# Patient Record
Sex: Female | Born: 1935 | Race: White | Hispanic: No | Smoking: Never smoker
Health system: Southern US, Community
[De-identification: ages and names within clinical notes are randomized; demographics above are authoritative.]

## PROBLEM LIST (undated history)

## (undated) DIAGNOSIS — K219 Gastro-esophageal reflux disease without esophagitis: Secondary | ICD-10-CM

## (undated) DIAGNOSIS — E039 Hypothyroidism, unspecified: Secondary | ICD-10-CM

## (undated) DIAGNOSIS — I10 Essential (primary) hypertension: Secondary | ICD-10-CM

## (undated) DIAGNOSIS — G20 Parkinson's disease: Secondary | ICD-10-CM

## (undated) DIAGNOSIS — G20A1 Parkinson's disease without dyskinesia, without mention of fluctuations: Secondary | ICD-10-CM

## (undated) DIAGNOSIS — M1990 Unspecified osteoarthritis, unspecified site: Secondary | ICD-10-CM

## (undated) HISTORY — DX: Parkinson's disease: G20

## (undated) HISTORY — DX: Gastro-esophageal reflux disease without esophagitis: K21.9

## (undated) HISTORY — DX: Hypothyroidism, unspecified: E03.9

## (undated) HISTORY — DX: Unspecified osteoarthritis, unspecified site: M19.90

## (undated) HISTORY — DX: Parkinson's disease without dyskinesia, without mention of fluctuations: G20.A1

## (undated) HISTORY — DX: Essential (primary) hypertension: I10

---

## 1957-09-23 HISTORY — PX: APPENDECTOMY: SHX54

## 1958-09-23 HISTORY — PX: THYROIDECTOMY: SHX17

## 1996-09-23 HISTORY — PX: CHOLECYSTECTOMY: SHX55

## 2005-05-29 ENCOUNTER — Ambulatory Visit: Payer: Self-pay | Admitting: Internal Medicine

## 2006-05-16 ENCOUNTER — Ambulatory Visit: Payer: Self-pay | Admitting: Internal Medicine

## 2006-06-03 ENCOUNTER — Ambulatory Visit: Payer: Self-pay | Admitting: Internal Medicine

## 2006-12-08 ENCOUNTER — Ambulatory Visit: Payer: Self-pay | Admitting: Ophthalmology

## 2006-12-15 ENCOUNTER — Ambulatory Visit: Payer: Self-pay | Admitting: Ophthalmology

## 2007-06-09 ENCOUNTER — Ambulatory Visit: Payer: Self-pay | Admitting: Internal Medicine

## 2008-03-14 ENCOUNTER — Ambulatory Visit: Payer: Self-pay | Admitting: Unknown Physician Specialty

## 2008-06-09 ENCOUNTER — Ambulatory Visit: Payer: Self-pay | Admitting: Internal Medicine

## 2008-07-07 ENCOUNTER — Ambulatory Visit: Payer: Self-pay | Admitting: Otolaryngology

## 2008-07-19 ENCOUNTER — Ambulatory Visit: Payer: Self-pay | Admitting: Otolaryngology

## 2009-06-14 ENCOUNTER — Ambulatory Visit: Payer: Self-pay | Admitting: Internal Medicine

## 2010-06-25 ENCOUNTER — Ambulatory Visit: Payer: Self-pay | Admitting: Internal Medicine

## 2010-11-13 ENCOUNTER — Encounter: Payer: Self-pay | Admitting: Rheumatology

## 2010-11-22 ENCOUNTER — Encounter: Payer: Self-pay | Admitting: Rheumatology

## 2011-06-14 ENCOUNTER — Ambulatory Visit: Payer: Self-pay | Admitting: Neurology

## 2011-08-05 ENCOUNTER — Ambulatory Visit: Payer: Self-pay | Admitting: Internal Medicine

## 2012-08-05 ENCOUNTER — Ambulatory Visit: Payer: Self-pay | Admitting: Internal Medicine

## 2013-08-06 ENCOUNTER — Ambulatory Visit: Payer: Self-pay | Admitting: Internal Medicine

## 2013-09-23 HISTORY — PX: BACK SURGERY: SHX140

## 2013-09-29 ENCOUNTER — Encounter: Payer: Self-pay | Admitting: Neurology

## 2013-10-24 ENCOUNTER — Encounter: Payer: Self-pay | Admitting: Neurology

## 2013-12-03 DIAGNOSIS — I10 Essential (primary) hypertension: Secondary | ICD-10-CM | POA: Insufficient documentation

## 2013-12-03 DIAGNOSIS — G5600 Carpal tunnel syndrome, unspecified upper limb: Secondary | ICD-10-CM | POA: Insufficient documentation

## 2013-12-03 DIAGNOSIS — M159 Polyosteoarthritis, unspecified: Secondary | ICD-10-CM | POA: Insufficient documentation

## 2013-12-03 DIAGNOSIS — G20 Parkinson's disease: Secondary | ICD-10-CM | POA: Insufficient documentation

## 2014-02-17 ENCOUNTER — Ambulatory Visit: Payer: Self-pay | Admitting: Internal Medicine

## 2014-02-21 ENCOUNTER — Ambulatory Visit: Payer: Self-pay | Admitting: Orthopedic Surgery

## 2014-02-22 ENCOUNTER — Ambulatory Visit: Payer: Self-pay | Admitting: Orthopedic Surgery

## 2014-02-25 LAB — PATHOLOGY REPORT

## 2014-03-08 DIAGNOSIS — Z9889 Other specified postprocedural states: Secondary | ICD-10-CM | POA: Insufficient documentation

## 2014-08-08 ENCOUNTER — Ambulatory Visit: Payer: Self-pay | Admitting: Internal Medicine

## 2014-12-07 DIAGNOSIS — M48061 Spinal stenosis, lumbar region without neurogenic claudication: Secondary | ICD-10-CM | POA: Insufficient documentation

## 2014-12-07 DIAGNOSIS — M5116 Intervertebral disc disorders with radiculopathy, lumbar region: Secondary | ICD-10-CM | POA: Insufficient documentation

## 2014-12-23 DIAGNOSIS — E039 Hypothyroidism, unspecified: Secondary | ICD-10-CM | POA: Insufficient documentation

## 2015-01-14 NOTE — Op Note (Signed)
PATIENT NAME:  Patricia GaleMCDOWELL, [name] MR#:  [id number] DATE OF BIRTH:  1934-03-16  DATE OF PROCEDURE:  02/22/2014  PREOPERATIVE DIAGNOSIS: T12 compression fracture.   POSTOPERATIVE DIAGNOSIS: T12 compression fracture.  PROCEDURE: T12 kyphoplasty.   ANESTHESIA: MAC.   SURGEON: Kennedy BuckerMichael Cristoval Teall, M.D.   DESCRIPTION OF PROCEDURE: The patient was brought to the Operating Room and, after adequate anesthesia, the patient was placed in a prone position. The C-arm was brought in, in AP and lateral projections. Good visualization of T12, which was markedly compressed. After patient identification and timeout procedures were completed, the back was prepped with alcohol; 10 mL was infiltrated, with 5 mL on either side of the planned approaches. After prepping and draping in the usual sterile fashion, repeat timeout procedure and patient identification was performed. A spinal needle was then used to get down to the level of the pedicle and infiltrated on the right side and left side; 10 mL of this mixture under fluoroscopic guidance. A small stab incision was made on the right side and a trocar advanced in an extrapedicular approach. Drilling was then carried out under fluoroscopy, and did cross the midline, and a very tight space under the superior endplate compression deformity. A balloon was placed and inflated to approximately 3.5 mL, with partial correction of a significant deformity. Cement was mixed, and the cement was inserted, with good fill. AP, lateral permanent views were obtained. The wound was covered on the right side with Dermabond, and a Band-Aid on the left, although no incision was made. Patient was sent to the recovery room in stable condition.   ESTIMATED BLOOD LOSS: Minimal.   COMPLICATIONS: None.   SPECIMEN: T12 vertebral body biopsy with minimal bone obtained.  CONDITION: To recovery room, stable.    ____________________________ Leitha SchullerMichael J. Charlise Giovanetti, MD mjm:cg D: 02/23/2014 00:58:09  ET T: 02/23/2014 02:35:15 ET JOB#: [id number]  cc: Leitha SchullerMichael J. Sharion Grieves, MD, <Dictator> Leitha SchullerMICHAEL J Birda Didonato MD ELECTRONICALLY SIGNED 02/24/2014 7:24

## 2015-03-10 ENCOUNTER — Ambulatory Visit: Payer: Self-pay

## 2015-03-23 ENCOUNTER — Encounter: Payer: Self-pay | Admitting: Urology

## 2015-03-23 ENCOUNTER — Ambulatory Visit (INDEPENDENT_AMBULATORY_CARE_PROVIDER_SITE_OTHER): Payer: PPO | Admitting: Urology

## 2015-03-23 VITALS — BP 137/78 | HR 101 | Ht 63.0 in | Wt 160.1 lb

## 2015-03-23 DIAGNOSIS — N3946 Mixed incontinence: Secondary | ICD-10-CM

## 2015-03-23 LAB — BLADDER SCAN AMB NON-IMAGING

## 2015-03-23 NOTE — Progress Notes (Signed)
I have been asked to see the patient by Dr. Sherryll BurgerShah, for evaluation and management of urinary incontinence.  History of present illness: [age]F seen today for incontinence.  She has a history of parkinson's disease.   The patient's incontinence really started about one year ago. She particularly has incontinence at nighttime. She states that she awakes from an urge to void and before she can make it to the bathroom she has voided almost completely. She gets up twice every night on average. This is not gotten significantly worse over the last several years. During the day she does not have any incontinence to speak of. She denies leakage with stress maneuvers such as laughing coughing, bending, and exercise. She states that she occasionally has difficulty getting or strain to start, and an equal number of times feels that she does not empty her bladder completely. However, this is not all the time. She denies any postvoid dribbling. She occasionally stops and starts prior to finishing her void completely. She describes a fairly weak stream. Overall, she denies any progression of her voiding symptoms over the last several years. The patient maintains her uterus and denies any symptoms of prolapse including vaginal pain/pressure. She does state that she was given topical steroid-induced because she had genital adhesions. This was sometime ago, she has not taken anything since.   She does not have a history of urinary tract infections, kidney stones, or gross hematuria. The patient has had 5 vaginal deliveries. The patient's Parkinson's disease as well controlled, she takes a small amount of dopamine agonist. This was recently increased slightly, however for the most part her symptoms have been minimal/stable. PVR: 12 mL's.  Review of systems: A 12 point comprehensive review of systems was obtained and is negative unless otherwise stated in the history of present illness.  There are no active problems to display for  this patient.   No current outpatient prescriptions on file prior to visit.   No current facility-administered medications on file prior to visit.    No past medical history on file.  No past surgical history on file.  History  Substance Use Topics  . Smoking status: Not on file  . Smokeless tobacco: Not on file  . Alcohol Use: Not on file    No family history on file.  PE: There were no vitals filed for this visit. Patient appears to be in no acute distress  patient is alert and oriented x3 Atraumatic normocephalic head No cervical or supraclavicular lymphadenopathy appreciated No increased work of breathing, no audible wheezes/rhonchi Regular sinus rhythm/rate Abdomen is soft, nontender, nondistended, no CVA or suprapubic tenderness The patient's vaginal exam demonstrates fusion of her labia with obliteration of the vaginal introitus. The urethral meatus is patent. This is consistent with lichen sclerosus. Lower extremities are symmetric without appreciable edema Grossly neurologically intact No identifiable skin lesions  No results for input(s): WBC, HGB, HCT in the last 72 hours. No results for input(s): NA, K, CL, CO2, GLUCOSE, BUN, CREATININE, CALCIUM in the last 72 hours. No results for input(s): LABPT, INR in the last 72 hours. No results for input(s): LABURIN in the last 72 hours. No results found for this or any previous visit.  Imaging: none  Imp:  The patient appears to have urge incontinence, worse at night. This is common in patients with Parkinson's disease. Her symptoms seem to be worse with a full bladder. 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  Recommendations:  We discussed behavior modifications including setting or alarm at night so that she voids more frequently and try to avoid over distention of her bladder which leads to her urge and overactivity. I also gave the patient Myrbetriq 50 mg  daily which I recommended that she consider to help with her overactive bladder. Plan for the patient to contact our office if she feels like the medication is helping and she would like a refill. As relates to the patient's lichen sclerosus, at this point I think I would leave it as is, we did discuss sterile cream if her urethra started to close, however it is fully patent on exam today.  Cc: Dr. Bethann Punches Cc: Dr. Clelia Croft, neurology [place], Earle Gell

## 2015-03-24 LAB — MICROSCOPIC EXAMINATION
Bacteria, UA: NONE SEEN
Epithelial Cells (non renal): >10 /hpf — AB (ref 0–10)

## 2015-03-24 LAB — URINALYSIS, COMPLETE
Bilirubin, UA: NEGATIVE
Glucose, UA: NEGATIVE
Nitrite, UA: NEGATIVE
Protein, UA: NEGATIVE
RBC, UA: NEGATIVE
Specific Gravity, UA: 1.015 (ref 1.005–1.030)
Urobilinogen, Ur: 1 mg/dL (ref 0.2–1.0)
pH, UA: 6.5 (ref 5.0–7.5)

## 2015-06-13 ENCOUNTER — Ambulatory Visit: Payer: PPO | Admitting: Physical Therapy

## 2015-06-13 ENCOUNTER — Ambulatory Visit: Payer: PPO | Attending: Neurology | Admitting: Speech Pathology

## 2015-06-13 DIAGNOSIS — R531 Weakness: Secondary | ICD-10-CM | POA: Insufficient documentation

## 2015-06-13 DIAGNOSIS — R490 Dysphonia: Secondary | ICD-10-CM | POA: Insufficient documentation

## 2015-06-13 DIAGNOSIS — R262 Difficulty in walking, not elsewhere classified: Secondary | ICD-10-CM | POA: Diagnosis present

## 2015-06-13 NOTE — Patient Instructions (Addendum)
PAIN: none POSTURE: WFL   PROM/AROM:grip left hand 8 lbs, right hand 14 lbs   STRENGTH:  Graded on a 0-5 scale Muscle Group Left Right  Shoulder flex 4 4  Shoulder Abd -4 -4  Shoulder Ext -4 -4  Shoulder IR/ER 4 4  Elbow 4 4  Wrist/hand 2 2  Hip Flex 3 3  Hip Abd 3 4  Hip Add 2 2  Hip Ext 2 2  Hip IR/ER    Knee Flex 4 4  Knee Ext 3+ 3+  Ankle DF 4 4  Ankle PF 4 4   SENSATION: WNL   SPECIAL TESTS: unable to perform 20 toe raises    FUNCTIONAL MOBILITY:WFL   BALANCE:static balance is por with inability to single leg raise Dynamic standing balance is poor with inabiity to tandem stand   GAIT: Patient ambulates with a SPC for short distances and decreased arm swing OUTCOME MEASURES: TEST Outcome Interpretation  5 times sit<>stand 24.75sec >27 yo, >15 sec indicates increased risk for falls  10 meter walk test .48                 m/s <1.0 m/s indicates increased risk for falls; limited community ambulator  Timed up and Go   21.03              sec <14 sec indicates increased risk for falls  6 minute walk test   700             Feet 1000 feet is community Financial controller  <36/56 (100% risk for falls), 37-45 (80% risk for falls); 46-51 (>50% risk for falls); 52-55 (lower risk <25% of falls)  9 Hole Peg Test L:31.86                R: 34.97     Low car transfer, writing, stepping over items, carrying items, arms fatigue with washing her hair, peeling a potato , holding a book, holding silver wear.

## 2015-06-13 NOTE — Therapy (Signed)
[facility] [facility] MAIN [facility] SERVICES [address] [place], [place], [address] Phone: [phone]   Fax:  [phone]  Physical Therapy Evaluation  Patient Details  Name: Patricia [name] MRN: [id number] Date of Birth: 1933-11-17 Referring Provider:  Lonell Face, MD  Encounter Date: 06/13/2015      PT End of Session - 06/13/15 1446    Visit Number 1   Number of Visits 17   Date for PT Re-Evaluation 07/18/15   PT Start Time 0300   PT Stop Time 0400   PT Time Calculation (min) 60 min   Activity Tolerance Patient tolerated treatment well   Behavior During Therapy [facility] for tasks assessed/performed      Past Medical History  Diagnosis Date  . GERD (gastroesophageal reflux disease)   . Arthritis   . Hypertension   . Hypothyroidism   . Parkinson disease     Past Surgical History  Procedure Laterality Date  . Back surgery  2015  . Appendectomy  1959  . Thyroidectomy  1960  . Cholecystectomy  1998    There were no vitals filed for this visit.  Visit Diagnosis:  Difficulty walking  Weakness      Subjective Assessment - 06/13/15 1453    Subjective Patient is concerned with her balance.    Currently in Pain? No/denies            [facility] PT Assessment - 06/13/15 0001    Assessment   Medical Diagnosis Parkinsons disease   Onset Date/Surgical Date 07/13/15   Hand Dominance Right   Next MD Visit July 25, 2015   Prior Therapy --  no   Precautions   Precautions None   Balance Screen   Has the patient fallen in the past 6 months No   Has the patient had a decrease in activity level because of a fear of falling?  Yes   Is the patient reluctant to leave their home because of a fear of falling?  No   Home Environment   Living Environment Private residence   Living Arrangements Alone   Available Help at Discharge Family   Type of Home House   Home Access Stairs to enter   Entrance Stairs-Number of Steps 5   Home Layout One  level   Home Equipment [place] - single point;Grab bars - tub/shower;Toilet riser   Prior Function   Level of Independence Independent        PAIN: none POSTURE: WFL   PROM/AROM:grip left hand 8 lbs, right hand 14 lbs   STRENGTH:  Graded on a 0-5 scale Muscle Group Left Right  Shoulder flex 4 4  Shoulder Abd -4 -4  Shoulder Ext -4 -4  Shoulder IR/ER 4 4  Elbow 4 4  Wrist/hand 2 2  Hip Flex 3 3  Hip Abd 3 4  Hip Add 2 2  Hip Ext 2 2  Hip IR/ER    Knee Flex 4 4  Knee Ext 3+ 3+  Ankle DF 4 4  Ankle PF 4 4   SENSATION: WNL   SPECIAL TESTS: unable to perform 20 toe raises    FUNCTIONAL MOBILITY:WFL   BALANCE:static balance is por with inability to single leg raise Dynamic standing balance is poor with inabiity to tandem stand   GAIT: Patient ambulates with a SPC for short distances and decreased arm swing OUTCOME MEASURES: TEST Outcome Interpretation  5 times sit<>stand 24.75sec >50 yo, >15 sec indicates increased risk for falls  10 meter walk test .48                 m/s <1.0 m/s indicates increased risk for falls; limited community ambulator  Timed up and Go   21.03              sec <14 sec indicates increased risk for falls  6 minute walk test   700             Feet 1000 feet is community Financial controller  <36/56 (100% risk for falls), 37-45 (80% risk for falls); 46-51 (>50% risk for falls); 52-55 (lower risk <25% of falls)  9 Hole Peg Test L:31.86                R: 34.97    Problems with : Low car transfer, writing, stepping over items, carrying items, arms fatigue with washing her hair, peeling a potato , holding a book, holding silver wea                    PT Education - 2015-07-06 1445    Education provided Yes   Education Details LSVT BIG   Person(s) Educated Patient   Methods Explanation   Comprehension Verbalized understanding             PT Long Term Goals - July 06, 2015 1517    PT LONG TERM GOAL #1   Title  Patient (> 20 years old) will complete five times sit to stand test in < 15 seconds indicating an increased LE strength and improved balance10/25/16   PT LONG TERM GOAL #2   Title Patient will increase six minute walk test distance to >1000 for progression to community ambulator and improve gait ability10/25/16   PT LONG TERM GOAL #3   Title Patient will increase 10 meter walk test to >1.28m/s as to improve gait speed for better community ambulation and to reduce fall risk. 07/18/15               Plan - 06-Jul-2015 1514    Clinical Impression Statement Patient is 81 years old has Dx of parkinsons and has unsteady gait with decreased strength BLE and hands. She has static and dynamic standing deficits and decreased functional mobiity.    Pt will benefit from skilled therapeutic intervention in order to improve on the following deficits Abnormal gait;Difficulty walking;Decreased activity tolerance;Decreased balance;Decreased strength;Decreased mobility;Decreased coordination   Rehab Potential Good   PT Frequency 4x / week   PT Duration 4 weeks   PT Treatment/Interventions Balance training;Therapeutic exercise;Therapeutic activities;Functional mobility training;Stair training;Gait training;ADLs/Self Care Home Management   PT Next Visit Plan LSVT BIG   Consulted and Agree with Plan of Care Patient          G-Codes - 06-Jul-2015 1520    Functional Assessment Tool Used 5 x sit to stand, tug, 6 MW   Functional Limitation Mobility: Walking and moving around   Mobility: Walking and Moving Around Current Status (W0981) At least 40 percent but less than 60 percent impaired, limited or restricted   Mobility: Walking and Moving Around Goal Status [phone]) At least 20 percent but less than 40 percent impaired, limited or restricted       Problem List Patient Active Problem List   Diagnosis Date Noted  . Acquired hypothyroidism 12/23/2014  . Neuritis or radiculitis due to rupture of lumbar  intervertebral disc 12/07/2014  . Lumbar canal stenosis 12/07/2014  . H/O surgical procedure 03/08/2014  .  Carpal tunnel syndrome 12/03/2013  . Benign essential HTN 12/03/2013  . Degenerative joint disease involving multiple joints 12/03/2013  . Idiopathic Parkinson's disease 12/03/2013    Ezekiel Ina 06/13/2015, 3:45 PM  [place] MAIN [facility] SERVICES [address] [place], [place], [address] Phone: [phone]   Fax:  [phone]

## 2015-06-14 ENCOUNTER — Encounter: Payer: Self-pay | Admitting: Speech Pathology

## 2015-06-14 NOTE — Therapy (Signed)
[place] [facility] MAIN [facility] SERVICES [address] [place], [place], [address] Phone: [phone]   Fax:  [phone]  Speech Language Pathology Evaluation  Patient Details  Name: Patricia [name] MRN: [id number] Date of Birth: September 05, 1934 Referring Provider:  Lonell Face, MD  Encounter Date: 06/13/2015      End of Session - 06/14/15 1129    Visit Number 1   Number of Visits 17   Date for SLP Re-Evaluation 07/21/15   SLP Start Time 1400   SLP Stop Time  1446   SLP Time Calculation (min) 46 min   Activity Tolerance Patient tolerated treatment well      Past Medical History  Diagnosis Date   GERD (gastroesophageal reflux disease)    Arthritis    Hypertension    Hypothyroidism    Parkinson disease     Past Surgical History  Procedure Laterality Date   Back surgery  2015   Appendectomy  1959   Thyroidectomy  1960   Cholecystectomy  1998    There were no vitals filed for this visit.  Visit Diagnosis: Dysphonia - Plan: SLP plan of care cert/re-cert      Subjective Assessment - 06/14/15 1127    Subjective The patient reports that she has observed changes due to Parkinson's; changes include hypophonia, hoarse vocal quality, and monotone speech.   Currently in Pain? No/denies            SLP Evaluation [facility] - 06/14/15 0001    SLP Visit Information   SLP Received On 06/13/15   Onset Date 02/07/2015   Medical Diagnosis Parkinson's disease   Subjective   Patient/Family Stated Goal To be understood   Pain Assessment   Currently in Pain? No/denies   Prior Functional Status   Cognitive/Linguistic Baseline --  Worsening speech intelligibility secondary PD   Standardized Assessments   Standardized Assessments  --  LSVT-LOUD Perceptual Voice Evaluation       LSVT-LOUD Voice Evaluation  Maximum phonation time for sustained ah: 5 seconds  Mean intensity during sustained ah: 58 dB   Mean intensity sustained  during conversational speech: 57 dB  Average fundamental frequency during sustained ah: 201 Hz (1.6 STD below mean for age and gender)  Highest dynamic pitch when altering pitch from a low note to a high note: 398 Hz  Highest pitch during conversational speech: 444 Hz  Lowest dynamic pitch when altering from a high note to a low note: 71 Hz  Lowest pitch during conversational speech: 91 Hz  Visi-Pitch: Multi-Dimensional Voice Program (MDVP)  MDVP extracts objective quantitative values (Relative Average Perturbation, Shimmer, Voice Turbulence Index, and Noise to Harmonic Ratio) on sustained phonation, which are displayed graphically and numerically in comparison to a built-in normative database.  The patient exhibited values outside the norm for Relative Average Perturbation, Shimmer, Voice Turbulence Index, and Noise to Harmonic Ratio.  Average fundamental frequency was 2.2 STD below the average for age and gender. The patient improved all parameters when cued to alter voicing (loud like me).          SLP Education - 06/14/15 1128    Education provided Yes   Education Details LSVT-LOUD   Person(s) Educated Patient   Methods Explanation   Comprehension Verbalized understanding            SLP Long Term Goals - 06/14/15 1131    SLP LONG TERM GOAL #1   Title The patient will complete Daily Tasks (Maximum duration "ah",  High/Lows, and Functional Phrases) at average loudness of 80 dB and with loud, good quality voice.    Time 4   Period Weeks   Status New   SLP LONG TERM GOAL #2   Title The patient will complete Hierarchal Speech Loudness reading drills (words/phrases, sentences, and paragraph) at average 75 dB and with loud, good quality voice.     Time 4   Period Weeks   Status New   SLP LONG TERM GOAL #3   Title The patient will complete homework daily.   Time 4   Period Weeks   Status New   SLP LONG TERM GOAL #4   Title The patient will participate in conversation,  maintaining average loudness of 75 dB and loud, good quality voice.   Time 4   Period Weeks   Status New          Plan - 06-17-15 1130    Clinical Impression Statement This 81 year old woman with diagnosed Parkinson' disease is presenting with moderate-severe voice disorder characterized by hoarse vocal quality, hypophonia, and monotone voice.  Based on stimulability testing, the patient is judged to be a good candidate for the LSVT LOUD program.  It is recommended that the patient receive the LSVT LOUD program which is comprised of 16 intensive sessions (4 times per week for 4 weeks, one hour sessions).  Prognosis for improvement is good based on his motivation, stimulability, and strong family support.  LSVT LOUD has been documented in the literature as efficacious for individuals with Parkinson's disease.     Speech Therapy Frequency 4x / week   Duration 4 weeks   Treatment/Interventions Other (comment)  LSVT-LOUD protocol   Potential to Achieve Goals Good   Potential Considerations Ability to learn/carryover information;Cooperation/participation level;Previous level of function;Severity of impairments;Other (comment)  Stimulability   SLP Home Exercise Plan LSVT-LOUD daily homework   Consulted and Agree with Plan of Care Patient          G-Codes - 06-17-15 1132    Functional Assessment Tool Used Perceptual Voice Evaluation   Functional Limitations Voice   Voice Current Status [phone]) At least 60 percent but less than 80 percent impaired, limited or restricted   Voice Goal Status (G9172) At least 1 percent but less than 20 percent impaired, limited or restricted      Problem List Patient Active Problem List   Diagnosis Date Noted   Acquired hypothyroidism 12/23/2014   Neuritis or radiculitis due to rupture of lumbar intervertebral disc 12/07/2014   Lumbar canal stenosis 12/07/2014   H/O surgical procedure 03/08/2014   Carpal tunnel syndrome 12/03/2013   Benign  essential HTN 12/03/2013   Degenerative joint disease involving multiple joints 12/03/2013   Idiopathic Parkinson's disease 12/03/2013   Dollene Primrose, MS/CCC- SLP  Leandrew Koyanagi 06/17/15, 11:36 AM  [place] [facility] MAIN [facility] SERVICES [address] [place], [place], [address] Phone: [phone]   Fax:  [phone]

## 2015-06-19 ENCOUNTER — Encounter: Payer: Self-pay | Admitting: Physical Therapy

## 2015-06-19 ENCOUNTER — Ambulatory Visit: Payer: PPO | Admitting: Physical Therapy

## 2015-06-19 ENCOUNTER — Ambulatory Visit: Payer: PPO | Admitting: Speech Pathology

## 2015-06-19 ENCOUNTER — Encounter: Payer: Self-pay | Admitting: Speech Pathology

## 2015-06-19 DIAGNOSIS — R531 Weakness: Secondary | ICD-10-CM

## 2015-06-19 DIAGNOSIS — R490 Dysphonia: Secondary | ICD-10-CM

## 2015-06-19 DIAGNOSIS — R262 Difficulty in walking, not elsewhere classified: Secondary | ICD-10-CM

## 2015-06-19 NOTE — Therapy (Signed)
[place] [facility] MAIN [facility] [employer] SERVICES [address] [place], [place], [address] Phone: [phone]   Fax:  [phone]  Speech Language Pathology Treatment  Patient Details  Name: Patricia [name] MRN: [id number] Date of Birth: 06/06/1934 Referring Provider:  Lonell Face, MD  Encounter Date: 06/19/2015      End of Session - 06/19/15 1510    Visit Number 2   Number of Visits 17   Date for SLP Re-Evaluation 07/21/15   SLP Start Time 1345   SLP Stop Time  1435   SLP Time Calculation (min) 50 min   Activity Tolerance Patient tolerated treatment well      Past Medical History  Diagnosis Date  . GERD (gastroesophageal reflux disease)   . Arthritis   . Hypertension   . Hypothyroidism   . Parkinson disease     Past Surgical History  Procedure Laterality Date  . Back surgery  2015  . Appendectomy  1959  . Thyroidectomy  1960  . Cholecystectomy  1998    There were no vitals filed for this visit.  Visit Diagnosis: Dysphonia      Subjective Assessment - 06/19/15 1510    Subjective Patient reports that her family have noticed her decreased loudness.   Currently in Pain? No/denies               ADULT SLP TREATMENT - 06/19/15 0001    General Information   Behavior/Cognition Alert;Cooperative;Pleasant mood   Treatment Provided   Treatment provided Cognitive-Linquistic   Pain Assessment   Pain Assessment No/denies pain   Cognitive-Linquistic Treatment   Treatment focused on Voice   Skilled Treatment Daily Task #1: Average 5 seconds, 60 dB. Daily Task 2: Highs: 15 high pitched "ah" given max cues. Lows: 15 low pitched "ah" given max cues. Daily task #3: Average 64 dB.  Hierarchal speech loudness drill: Read words/phrases, 64 dB. Homework: assignments given.  Off the cuff remarks: average 64 dB.     Assessment / Recommendations / Plan   Plan Continue with current plan of care   Progression Toward Goals   Progression toward goals  Progressing toward goals          SLP Education - 06/19/15 1510    Education provided Yes   Education Details LSVT-LOUD   Person(s) Educated Patient   Methods Explanation;Demonstration;Verbal cues;Handout   Comprehension Verbalized understanding;Returned demonstration;Verbal cues required;Need further instruction            SLP Long Term Goals - 06/14/15 1131    SLP LONG TERM GOAL #1   Title The patient will complete Daily Tasks (Maximum duration "ah", High/Lows, and Functional Phrases) at average loudness of 80 dB and with loud, good quality voice.    Time 4   Period Weeks   Status New   SLP LONG TERM GOAL #2   Title The patient will complete Hierarchal Speech Loudness reading drills (words/phrases, sentences, and paragraph) at average 75 dB and with loud, good quality voice.     Time 4   Period Weeks   Status New   SLP LONG TERM GOAL #3   Title The patient will complete homework daily.   Time 4   Period Weeks   Status New   SLP LONG TERM GOAL #4   Title The patient will participate in conversation, maintaining average loudness of 75 dB and loud, good quality voice.   Time 4   Period Weeks   Status New  Plan - 06/19/15 1511    Clinical Impression Statement The patient is completing daily tasks and hierarchal speech drill tasks with louder, better quality voice given max SLP cues.     Speech Therapy Frequency 4x / week   Duration 4 weeks   Treatment/Interventions Other (comment)  LSVT-LOUD   Potential to Achieve Goals Good   Potential Considerations Ability to learn/carryover information;Cooperation/participation level;Previous level of function;Severity of impairments;Other (comment)   SLP Home Exercise Plan LSVT-LOUD daily homework   Consulted and Agree with Plan of Care Patient        Problem List Patient Active Problem List   Diagnosis Date Noted  . Acquired hypothyroidism 12/23/2014  . Neuritis or radiculitis due to rupture of lumbar  intervertebral disc 12/07/2014  . Lumbar canal stenosis 12/07/2014  . H/O surgical procedure 03/08/2014  . Carpal tunnel syndrome 12/03/2013  . Benign essential HTN 12/03/2013  . Degenerative joint disease involving multiple joints 12/03/2013  . Idiopathic Parkinson's disease 12/03/2013   Dollene Primrose, MS/CCC- SLP  Leandrew Koyanagi 06/19/2015, 3:13 PM  [place] [facility] MAIN [facility] SERVICES [address] [place], [place], [address] Phone: [phone]   Fax:  [phone]

## 2015-06-19 NOTE — Therapy (Signed)
[place] [facility] MAIN [facility] SERVICES [address] [place], [place], [address] Phone: [phone]   Fax:  [phone]  Physical Therapy Treatment  Patient Details  Name: Patricia [name] MRN: [id number] Date of Birth: Mar 11, 1934 Referring Provider:  Lonell Face, MD  Encounter Date: 06/19/2015      PT End of Session - 06/19/15 1502    Visit Number 2   Authorization Type 2   PT Start Time 0300   PT Stop Time 0400   PT Time Calculation (min) 60 min   Activity Tolerance Patient tolerated treatment well      Past Medical History  Diagnosis Date  . GERD (gastroesophageal reflux disease)   . Arthritis   . Hypertension   . Hypothyroidism   . Parkinson disease     Past Surgical History  Procedure Laterality Date  . Back surgery  2015  . Appendectomy  1959  . Thyroidectomy  1960  . Cholecystectomy  1998    There were no vitals filed for this visit.  Visit Diagnosis:  Difficulty walking  Weakness      Subjective Assessment - 06/19/15 1501    Subjective Patient is concerned with her balance.    Currently in Pain? No/denies      floor to ceiling x 10 reps, side to side 10 reps, step and reach forward x 10 reps, step and reach backwards x 10,  step and reach sideways x 10 , Rock and reach forward/backward x 10 , Rock and reach sideways x 10, functional tasks; 1 sit to stand 2. Reaching with cones to duplicate putting dishes away 3. hand coordination tasks 4. stepping up from foam to stool 5. washing hair and drying hair  Patient continues to demonstrates less incoordination of movement with select exercises such as rock and reach and stepping backwards.  . Mod cueing needed to appropriately perform LSVT tasks with leg, hand, and head position. Decreased coordination demonstrated requiring consistent verbal cueing to correct form. Cognitive understanding of task was delayed. Patient continues to demonstrate some in coordination of movement  with select exercises such as rock and reach and stepping backwards. Patient responds well to verbal and tactile cues to correct form and technique.  CGA to SBA for safety with activities.  Uses to increase intensity and amplitude of movements throughout session                           PT Education - 06/19/15 1501    Education provided Yes   Education Details LSVT BIG   Person(s) Educated Patient   Methods Explanation   Comprehension Verbalized understanding             PT Long Term Goals - 06/13/15 1517    PT LONG TERM GOAL #1   Title Patient (> 14 years old) will complete five times sit to stand test in < 15 seconds indicating an increased LE strength and improved balance10/25/16   PT LONG TERM GOAL #2   Title Patient will increase six minute walk test distance to >1000 for progression to community ambulator and improve gait ability10/25/16   PT LONG TERM GOAL #3   Title Patient will increase 10 meter walk test to >1.25m/s as to improve gait speed for better community ambulation and to reduce fall risk. 07/18/15               Plan - 06/19/15 1503    Clinical Impression  Statement CGA to SBA for safety with activities.  Uses to increase intensity and amplitude of movements throughout session. pt especially has difficulty with opening his hands up towards the ceiling with multiple exercises and with extending his leg during seated side to side multi-directional reaching activity   Pt will benefit from skilled therapeutic intervention in order to improve on the following deficits Abnormal gait;Difficulty walking;Decreased activity tolerance;Decreased balance;Decreased strength;Decreased mobility;Decreased coordination   Rehab Potential Good   PT Frequency 4x / week   PT Duration 4 weeks   PT Treatment/Interventions Balance training;Therapeutic exercise;Therapeutic activities;Functional mobility training;Stair training;Gait training;ADLs/Self Care Home  Management   PT Next Visit Plan LSVT BIG   Consulted and Agree with Plan of Care Patient        Problem List Patient Active Problem List   Diagnosis Date Noted  . Acquired hypothyroidism 12/23/2014  . Neuritis or radiculitis due to rupture of lumbar intervertebral disc 12/07/2014  . Lumbar canal stenosis 12/07/2014  . H/O surgical procedure 03/08/2014  . Carpal tunnel syndrome 12/03/2013  . Benign essential HTN 12/03/2013  . Degenerative joint disease involving multiple joints 12/03/2013  . Idiopathic Parkinson's disease 12/03/2013    Ezekiel Ina 06/19/2015, 3:05 PM  [place] [facility] MAIN [facility] SERVICES [address] [place], [place], [address] Phone: [phone]   Fax:  [phone]

## 2015-06-20 ENCOUNTER — Encounter: Payer: Self-pay | Admitting: Physical Therapy

## 2015-06-20 ENCOUNTER — Ambulatory Visit: Payer: PPO | Admitting: Physical Therapy

## 2015-06-20 ENCOUNTER — Ambulatory Visit: Payer: PPO | Admitting: Speech Pathology

## 2015-06-20 DIAGNOSIS — R490 Dysphonia: Secondary | ICD-10-CM

## 2015-06-20 DIAGNOSIS — R262 Difficulty in walking, not elsewhere classified: Secondary | ICD-10-CM

## 2015-06-20 DIAGNOSIS — R531 Weakness: Secondary | ICD-10-CM

## 2015-06-20 NOTE — Therapy (Signed)
[place] [facility] MAIN [facility] SERVICES [address] [place], [place], [address] Phone: [phone]   Fax:  [phone]  Physical Therapy Treatment  Patient Details  Name: Patricia [name] MRN: [id number] Date of Birth: 07-26-1934 Referring Provider:  Lonell Face, MD  Encounter Date: 06/20/2015      PT End of Session - 06/20/15 1522    Visit Number 3   Authorization Type 3   PT Start Time 0300   PT Stop Time 0400   PT Time Calculation (min) 60 min   Activity Tolerance Patient tolerated treatment well   Behavior During Therapy [facility] for tasks assessed/performed      Past Medical History  Diagnosis Date  . GERD (gastroesophageal reflux disease)   . Arthritis   . Hypertension   . Hypothyroidism   . Parkinson disease     Past Surgical History  Procedure Laterality Date  . Back surgery  2015  . Appendectomy  1959  . Thyroidectomy  1960  . Cholecystectomy  1998    There were no vitals filed for this visit.  Visit Diagnosis:  Difficulty walking  Weakness      Subjective Assessment - 06/20/15 1518    Subjective Patient is having some unsteady gait.    Currently in Pain? No/denies         floor to ceiling x 10 reps, side to side 10 reps, step and reach forward x 10 reps, step and reach backwards x 10, step and reach sideways x 10 , Rock and reach forward/backward x 10 , Rock and reach sideways x 10, functional tasks; 1 sit to stand 2. Reaching with cones to duplicate putting dishes away 3. hand coordination tasks 4. stepping up from foam to stool 5. washing hair and drying hair  Patient continues to demonstrates less incoordination of movement with select exercises such as rock and reach and stepping backwards Min cueing needed to appropriately perform LSVT tasks with leg, hand, and head position. Decreased coordination demonstrated requiring consistent verbal cueing to correct form. Cognitive understanding of task was delayed.  Patient continues to demonstrate some in coordination of movement with select exercises such as rock and reach and stepping backwards. Patient responds well to verbal and tactile cues to correct form and technique.  CGA to SBA for safety with activities.  Uses to increase intensity and amplitude of movements throughout session                         PT Education - 06/20/15 1521    Education provided Yes   Education Details LSVT BIG   Person(s) Educated Patient   Methods Explanation   Comprehension Verbalized understanding             PT Long Term Goals - 06/13/15 1517    PT LONG TERM GOAL #1   Title Patient (> 55 years old) will complete five times sit to stand test in < 15 seconds indicating an increased LE strength and improved balance10/25/16   PT LONG TERM GOAL #2   Title Patient will increase six minute walk test distance to >1000 for progression to community ambulator and improve gait ability10/25/16   PT LONG TERM GOAL #3   Title Patient will increase 10 meter walk test to >1.31m/s as to improve gait speed for better community ambulation and to reduce fall risk. 07/18/15               Plan -  06/20/15 1524    Clinical Impression Statement . Cuing is needed to stretch the leg out in seated side reaching..pt especially has difficulty with opening his hands up towards the ceiling with multiple exercises and with extending his leg during seated side to side multi-directional reaching activity   Pt will benefit from skilled therapeutic intervention in order to improve on the following deficits Abnormal gait;Difficulty walking;Decreased activity tolerance;Decreased balance;Decreased strength;Decreased mobility;Decreased coordination   Rehab Potential Good   PT Frequency 4x / week   PT Duration 4 weeks   PT Treatment/Interventions Balance training;Therapeutic exercise;Therapeutic activities;Functional mobility training;Stair training;Gait training;ADLs/Self  Care Home Management   PT Next Visit Plan LSVT BIG   Consulted and Agree with Plan of Care Patient        Problem List Patient Active Problem List   Diagnosis Date Noted  . Acquired hypothyroidism 12/23/2014  . Neuritis or radiculitis due to rupture of lumbar intervertebral disc 12/07/2014  . Lumbar canal stenosis 12/07/2014  . H/O surgical procedure 03/08/2014  . Carpal tunnel syndrome 12/03/2013  . Benign essential HTN 12/03/2013  . Degenerative joint disease involving multiple joints 12/03/2013  . Idiopathic Parkinson's disease 12/03/2013    Ezekiel Ina 06/20/2015, 3:28 PM  [place] [facility] MAIN [facility] SERVICES [address] [place], [place], [address] Phone: [phone]   Fax:  [phone]

## 2015-06-21 ENCOUNTER — Encounter: Payer: Self-pay | Admitting: Physical Therapy

## 2015-06-21 ENCOUNTER — Ambulatory Visit: Payer: PPO | Admitting: Physical Therapy

## 2015-06-21 ENCOUNTER — Encounter: Payer: Self-pay | Admitting: Speech Pathology

## 2015-06-21 ENCOUNTER — Ambulatory Visit: Payer: PPO | Admitting: Speech Pathology

## 2015-06-21 DIAGNOSIS — R490 Dysphonia: Secondary | ICD-10-CM

## 2015-06-21 DIAGNOSIS — R531 Weakness: Secondary | ICD-10-CM

## 2015-06-21 DIAGNOSIS — R262 Difficulty in walking, not elsewhere classified: Secondary | ICD-10-CM

## 2015-06-21 NOTE — Therapy (Signed)
[place] [facility] MAIN [facility] SERVICES [address] [place], [place], [address] Phone: [phone]   Fax:  [phone]  Speech Language Pathology Treatment  Patient Details  Name: Patricia [name] MRN: [id number] Date of Birth: [birth year] Referring Provider:  Lonell Face, MD  Encounter Date: 06/21/2015      End of Session - 06/21/15 1518    Visit Number 4   Number of Visits 17   Date for SLP Re-Evaluation 07/21/15   SLP Start Time 1400   SLP Stop Time  1457   SLP Time Calculation (min) 57 min   Activity Tolerance Patient tolerated treatment well      Past Medical History  Diagnosis Date  . GERD (gastroesophageal reflux disease)   . Arthritis   . Hypertension   . Hypothyroidism   . Parkinson disease     Past Surgical History  Procedure Laterality Date  . Back surgery  2015  . Appendectomy  1959  . Thyroidectomy  1960  . Cholecystectomy  1998    There were no vitals filed for this visit.  Visit Diagnosis: Dysphonia      Subjective Assessment - 06/21/15 1516    Subjective Patient reports her voice was hoarse this morning.   Currently in Pain? No/denies               ADULT SLP TREATMENT - 06/21/15 1517    General Information   Behavior/Cognition Alert;Cooperative;Pleasant mood   Treatment Provided   Treatment provided Cognitive-Linquistic   Pain Assessment   Pain Assessment No/denies pain   Cognitive-Linquistic Treatment   Treatment focused on Voice   Skilled Treatment Daily Task #1: Average 6 seconds, 73 dB. Daily Task 2: Highs: 15 high pitched "ah" given mod-max cues. Lows: 15 low pitched "ah" given mod-max cues. Daily task #3: Average 64 dB.  Hierarchal speech loudness drill: Read words/phrases, 68 dB. Homework: assignments completed.  Off the cuff remarks: average 65 dB.   Assessment / Recommendations / Plan   Plan Continue with current plan of care   Progression Toward Goals   Progression toward goals  Progressing toward goals          SLP Education - 06/21/15 1518    Education provided Yes   Education Details LSVT-LOUD   Person(s) Educated Patient   Methods Explanation;Demonstration;Verbal cues;Handout   Comprehension Verbalized understanding;Returned demonstration;Verbal cues required;Need further instruction            SLP Long Term Goals - 06/14/15 1131    SLP LONG TERM GOAL #1   Title The patient will complete Daily Tasks (Maximum duration "ah", High/Lows, and Functional Phrases) at average loudness of 80 dB and with loud, good quality voice.    Time 4   Period Weeks   Status New   SLP LONG TERM GOAL #2   Title The patient will complete Hierarchal Speech Loudness reading drills (words/phrases, sentences, and paragraph) at average 75 dB and with loud, good quality voice.     Time 4   Period Weeks   Status New   SLP LONG TERM GOAL #3   Title The patient will complete homework daily.   Time 4   Period Weeks   Status New   SLP LONG TERM GOAL #4   Title The patient will participate in conversation, maintaining average loudness of 75 dB and loud, good quality voice.   Time 4   Period Weeks   Status New  Plan - 06/21/15 1519    Clinical Impression Statement The patient is completing daily tasks and hierarchal speech drill tasks with louder, better quality voice given max SLP cues.     Speech Therapy Frequency 4x / week   Duration 4 weeks   Treatment/Interventions Other (comment)  LSVT-LOUD   Potential to Achieve Goals Good   Potential Considerations Ability to learn/carryover information;Cooperation/participation level;Previous level of function;Severity of impairments;Other (comment)   SLP Home Exercise Plan LSVT-LOUD daily homework   Consulted and Agree with Plan of Care Patient        Problem List Patient Active Problem List   Diagnosis Date Noted  . Acquired hypothyroidism 12/23/2014  . Neuritis or radiculitis due to rupture of lumbar  intervertebral disc 12/07/2014  . Lumbar canal stenosis 12/07/2014  . H/O surgical procedure 03/08/2014  . Carpal tunnel syndrome 12/03/2013  . Benign essential HTN 12/03/2013  . Degenerative joint disease involving multiple joints 12/03/2013  . Idiopathic Parkinson's disease 12/03/2013   Dollene Primrose, MS/CCC- SLP  Leandrew Koyanagi 06/21/2015, 3:20 PM  [place] [facility] MAIN [facility] SERVICES [address] [place], [place], [address] Phone: [phone]   Fax:  [phone]

## 2015-06-21 NOTE — Therapy (Signed)
[place] [facility] MAIN [facility] SERVICES [address] [place], [place], [address] Phone: [phone]   Fax:  [phone]  Physical Therapy Treatment  Patient Details  Name: Patricia [name] MRN: [id number] Date of Birth: 25-Jul-1934 Referring Provider:  Lonell Face, MD  Encounter Date: 06/21/2015      PT End of Session - 06/21/15 1510    Visit Number 4   Number of Visits 17   Date for PT Re-Evaluation 07/18/15   PT Start Time 0300   PT Stop Time 0355   PT Time Calculation (min) 55 min   Activity Tolerance Patient tolerated treatment well      Past Medical History  Diagnosis Date  . GERD (gastroesophageal reflux disease)   . Arthritis   . Hypertension   . Hypothyroidism   . Parkinson disease     Past Surgical History  Procedure Laterality Date  . Back surgery  2015  . Appendectomy  1959  . Thyroidectomy  1960  . Cholecystectomy  1998    There were no vitals filed for this visit.  Visit Diagnosis:  Difficulty walking  Weakness      Subjective Assessment - 06/21/15 1509    Subjective Patient is having some unsteady gait.    Currently in Pain? No/denies           floor to ceiling x 10 reps, side to side 10 reps, step and reach forward x 10 reps, step and reach backwards x 10, step and reach sideways x 10 , Rock and reach forward/backward x 10 , Rock and reach sideways x 10, functional tasks; 1 sit to stand 2. Reaching with cones to duplicate putting dishes away 3. hand coordination tasks 4. stepping up from foam to stool 5. washing hair and drying hair  Patient continues to demonstrates less incoordination of movement with select exercises such as rock and reach and stepping backwards Min cueing needed to appropriately perform LSVT tasks with leg, hand, and head position. Decreased coordination demonstrated requiring consistent verbal cueing to correct form. Cognitive understanding of task was delayed. Patient continues to  demonstrate some in coordination of movement with select exercises such as rock and reach and stepping backwards. Patient responds well to verbal and tactile cues to correct form and technique. CGA to SBA for safety with activities. Uses to increase intensity and amplitude of movements throughout session                          PT Education - 06/21/15 1509    Education provided Yes   Education Details LSVT BIG   Person(s) Educated Patient   Methods Explanation   Comprehension Verbalized understanding             PT Long Term Goals - 06/13/15 1517    PT LONG TERM GOAL #1   Title Patient (> 46 years old) will complete five times sit to stand test in < 15 seconds indicating an increased LE strength and improved balance10/25/16   PT LONG TERM GOAL #2   Title Patient will increase six minute walk test distance to >1000 for progression to community ambulator and improve gait ability10/25/16   PT LONG TERM GOAL #3   Title Patient will increase 10 meter walk test to >1.80m/s as to improve gait speed for better community ambulation and to reduce fall risk. 07/18/15               Plan -  06/21/15 1511    Clinical Impression Statement Cognitive understanding of task was delayed   Pt will benefit from skilled therapeutic intervention in order to improve on the following deficits Abnormal gait;Difficulty walking;Decreased activity tolerance;Decreased balance;Decreased strength;Decreased mobility;Decreased coordination   Rehab Potential Good   PT Frequency 4x / week   PT Duration 4 weeks   PT Treatment/Interventions Balance training;Therapeutic exercise;Therapeutic activities;Functional mobility training;Stair training;Gait training;ADLs/Self Care Home Management   PT Next Visit Plan LSVT BIG   Consulted and Agree with Plan of Care Patient        Problem List Patient Active Problem List   Diagnosis Date Noted  . Acquired hypothyroidism 12/23/2014  .  Neuritis or radiculitis due to rupture of lumbar intervertebral disc 12/07/2014  . Lumbar canal stenosis 12/07/2014  . H/O surgical procedure 03/08/2014  . Carpal tunnel syndrome 12/03/2013  . Benign essential HTN 12/03/2013  . Degenerative joint disease involving multiple joints 12/03/2013  . Idiopathic Parkinson's disease 12/03/2013    Ezekiel Ina 06/21/2015, 3:15 PM  [place] [facility] MAIN [facility] SERVICES [address] [place], [place], [address] Phone: [phone]   Fax:  [phone]

## 2015-06-21 NOTE — Therapy (Signed)
[place] MAIN [facility] SERVICES [address] [place], [place], [address] Phone: [phone]   Fax:  [phone]  Speech Language Pathology Treatment  Patient Details  Name: Patricia [name] MRN: [id number] Date of Birth: Mar 29, 1934 Referring Provider:  Lonell Face, MD  Encounter Date: 06/20/2015      End of Session - 06/21/15 0944    Visit Number 3   Number of Visits 17   Date for SLP Re-Evaluation 07/21/15   SLP Start Time 1400   SLP Stop Time  1455   SLP Time Calculation (min) 55 min   Activity Tolerance Patient tolerated treatment well      Past Medical History  Diagnosis Date  . GERD (gastroesophageal reflux disease)   . Arthritis   . Hypertension   . Hypothyroidism   . Parkinson disease     Past Surgical History  Procedure Laterality Date  . Back surgery  2015  . Appendectomy  1959  . Thyroidectomy  1960  . Cholecystectomy  1998    There were no vitals filed for this visit.  Visit Diagnosis: Dysphonia      Subjective Assessment - 06/21/15 0943    Subjective Patient reports that her family have noticed her decreased loudness.               ADULT SLP TREATMENT - 06/21/15 0001    General Information   Behavior/Cognition Alert;Cooperative;Pleasant mood   Treatment Provided   Treatment provided Cognitive-Linquistic   Pain Assessment   Pain Assessment No/denies pain   Cognitive-Linquistic Treatment   Treatment focused on Voice   Skilled Treatment Daily Task #1: Average 6 seconds, 75 dB. Daily Task 2: Highs: 15 high pitched "ah" given mod-max cues. Lows: 15 low pitched "ah" given mod-max cues. Daily task #3: Average 68 dB.  Hierarchal speech loudness drill: Read words/phrases, 70 dB. Homework: assignments completed.  Off the cuff remarks: average 65 dB.   Assessment / Recommendations / Plan   Plan Continue with current plan of care   Progression Toward Goals   Progression toward goals Progressing toward  goals          SLP Education - 06/21/15 0944    Education provided Yes   Education Details LSVT-LOUD   Person(s) Educated Patient   Methods Explanation;Demonstration;Verbal cues;Handout   Comprehension Verbalized understanding;Returned demonstration;Verbal cues required;Need further instruction            SLP Long Term Goals - 06/14/15 1131    SLP LONG TERM GOAL #1   Title The patient will complete Daily Tasks (Maximum duration "ah", High/Lows, and Functional Phrases) at average loudness of 80 dB and with loud, good quality voice.    Time 4   Period Weeks   Status New   SLP LONG TERM GOAL #2   Title The patient will complete Hierarchal Speech Loudness reading drills (words/phrases, sentences, and paragraph) at average 75 dB and with loud, good quality voice.     Time 4   Period Weeks   Status New   SLP LONG TERM GOAL #3   Title The patient will complete homework daily.   Time 4   Period Weeks   Status New   SLP LONG TERM GOAL #4   Title The patient will participate in conversation, maintaining average loudness of 75 dB and loud, good quality voice.   Time 4   Period Weeks   Status New          Plan -  06/21/15 0945    Clinical Impression Statement The patient is completing daily tasks and hierarchal speech drill tasks with louder, better quality voice given max SLP cues.     Speech Therapy Frequency 4x / week   Duration 4 weeks   Treatment/Interventions Other (comment)  LSVT-LOUD   Potential to Achieve Goals Good   Potential Considerations Ability to learn/carryover information;Cooperation/participation level;Previous level of function;Severity of impairments;Other (comment)   SLP Home Exercise Plan LSVT-LOUD daily homework   Consulted and Agree with Plan of Care Patient        Problem List Patient Active Problem List   Diagnosis Date Noted  . Acquired hypothyroidism 12/23/2014  . Neuritis or radiculitis due to rupture of lumbar intervertebral disc  12/07/2014  . Lumbar canal stenosis 12/07/2014  . H/O surgical procedure 03/08/2014  . Carpal tunnel syndrome 12/03/2013  . Benign essential HTN 12/03/2013  . Degenerative joint disease involving multiple joints 12/03/2013  . Idiopathic Parkinson's disease 12/03/2013   Dollene Primrose, MS/CCC- SLP  Leandrew Koyanagi 06/21/2015, 9:46 AM  [place] [facility] MAIN [facility] SERVICES [address] [place], [place], [address] Phone: [phone]   Fax:  [phone]

## 2015-06-22 ENCOUNTER — Ambulatory Visit: Payer: PPO | Admitting: Speech Pathology

## 2015-06-26 ENCOUNTER — Encounter: Payer: Self-pay | Admitting: Speech Pathology

## 2015-06-26 ENCOUNTER — Encounter: Payer: Self-pay | Admitting: Physical Therapy

## 2015-06-26 ENCOUNTER — Ambulatory Visit: Payer: PPO | Attending: Neurology | Admitting: Speech Pathology

## 2015-06-26 ENCOUNTER — Ambulatory Visit: Payer: PPO | Admitting: Physical Therapy

## 2015-06-26 DIAGNOSIS — N8189 Other female genital prolapse: Secondary | ICD-10-CM | POA: Diagnosis present

## 2015-06-26 DIAGNOSIS — R279 Unspecified lack of coordination: Secondary | ICD-10-CM | POA: Diagnosis present

## 2015-06-26 DIAGNOSIS — R262 Difficulty in walking, not elsewhere classified: Secondary | ICD-10-CM | POA: Insufficient documentation

## 2015-06-26 DIAGNOSIS — R531 Weakness: Secondary | ICD-10-CM

## 2015-06-26 DIAGNOSIS — R490 Dysphonia: Secondary | ICD-10-CM | POA: Diagnosis not present

## 2015-06-26 NOTE — Therapy (Signed)
[place] [facility] MAIN [facility] SERVICES [address] [place], [place], [address] Phone: [phone]   Fax:  [phone]  Speech Language Pathology Treatment  Patient Details  Name: Patricia [name] MRN: [id number] Date of Birth: 11-17-1933 Referring Provider:  Lonell Face, MD  Encounter Date: 06/26/2015      End of Session - 06/26/15 1450    Visit Number 5   Number of Visits 17   Date for SLP Re-Evaluation 07/21/15   SLP Start Time 1345   SLP Stop Time  1440   SLP Time Calculation (min) 55 min   Activity Tolerance Patient tolerated treatment well      Past Medical History  Diagnosis Date  . GERD (gastroesophageal reflux disease)   . Arthritis   . Hypertension   . Hypothyroidism   . Parkinson disease [facility])     Past Surgical History  Procedure Laterality Date  . Back surgery  2015  . Appendectomy  1959  . Thyroidectomy  1960  . Cholecystectomy  1998    There were no vitals filed for this visit.  Visit Diagnosis: Dysphonia      Subjective Assessment - 06/26/15 1449    Subjective Patient reports her voice is "not as raspy sometimes".   Currently in Pain? No/denies               ADULT SLP TREATMENT - 06/26/15 0001    General Information   Behavior/Cognition Alert;Cooperative;Pleasant mood   Treatment Provided   Treatment provided Cognitive-Linquistic   Pain Assessment   Pain Assessment No/denies pain   Cognitive-Linquistic Treatment   Treatment focused on Voice   Skilled Treatment Daily Task #1: Average 6 seconds, 75 dB. Daily Task 2: Highs: 15 high pitched "ah" given mod-max cues. Lows: 15 low pitched "ah" given mod-max cues. Daily task #3: Average 70 dB.  Hierarchal speech loudness drill: Imitate words/phrases, 70 dB. Read sentences, 70 dB.  Homework: assignments completed.  Off the cuff remarks: average 65 dB.   Assessment / Recommendations / Plan   Plan Continue with current plan of care   Progression Toward  Goals   Progression toward goals Progressing toward goals          SLP Education - 06/26/15 1449    Education provided Yes   Education Details LSVT-LOUD   Person(s) Educated Patient   Methods Explanation;Demonstration;Verbal cues;Handout   Comprehension Verbalized understanding;Returned demonstration;Verbal cues required;Need further instruction            SLP Long Term Goals - 06/14/15 1131    SLP LONG TERM GOAL #1   Title The patient will complete Daily Tasks (Maximum duration "ah", High/Lows, and Functional Phrases) at average loudness of 80 dB and with loud, good quality voice.    Time 4   Period Weeks   Status New   SLP LONG TERM GOAL #2   Title The patient will complete Hierarchal Speech Loudness reading drills (words/phrases, sentences, and paragraph) at average 75 dB and with loud, good quality voice.     Time 4   Period Weeks   Status New   SLP LONG TERM GOAL #3   Title The patient will complete homework daily.   Time 4   Period Weeks   Status New   SLP LONG TERM GOAL #4   Title The patient will participate in conversation, maintaining average loudness of 75 dB and loud, good quality voice.   Time 4   Period Weeks   Status New  Plan - 06/26/15 1450    Clinical Impression Statement The patient is completing daily tasks and hierarchal speech drill tasks with louder, better quality voice given max SLP cues.     Speech Therapy Frequency 4x / week   Duration 4 weeks   Treatment/Interventions Other (comment)  LSVT-LOUD   Potential to Achieve Goals Good   Potential Considerations Ability to learn/carryover information;Cooperation/participation level;Previous level of function;Severity of impairments;Other (comment)   SLP Home Exercise Plan LSVT-LOUD daily homework   Consulted and Agree with Plan of Care Patient        Problem List Patient Active Problem List   Diagnosis Date Noted  . Acquired hypothyroidism 12/23/2014  . Neuritis or  radiculitis due to rupture of lumbar intervertebral disc 12/07/2014  . Lumbar canal stenosis 12/07/2014  . H/O surgical procedure 03/08/2014  . Carpal tunnel syndrome 12/03/2013  . Benign essential HTN 12/03/2013  . Degenerative joint disease involving multiple joints 12/03/2013  . Idiopathic Parkinson's disease (HCC) 12/03/2013   Dollene Primrose, MS/CCC- SLP  Leandrew Koyanagi 06/26/2015, 2:51 PM  [place] [facility] MAIN [facility] SERVICES [address] [place], [place], [address] Phone: [phone]   Fax:  [phone]

## 2015-06-26 NOTE — Therapy (Signed)
[place] [facility] MAIN [facility] SERVICES [address] [place], [place], [address] Phone: [phone]   Fax:  [phone]  Physical Therapy Treatment  Patient Details  Name: Patricia [name] MRN: [id number] Date of Birth: 05/20/1934 Referring Provider:  Lonell Face, MD  Encounter Date: 06/26/2015      PT End of Session - 06/26/15 1503    Visit Number 5   Number of Visits 17   Date for PT Re-Evaluation 07/18/15   Authorization Type 5   PT Start Time 0300   PT Stop Time 0355   PT Time Calculation (min) 55 min   Activity Tolerance Patient tolerated treatment well   Behavior During Therapy [facility] for tasks assessed/performed      Past Medical History  Diagnosis Date  . GERD (gastroesophageal reflux disease)   . Arthritis   . Hypertension   . Hypothyroidism   . Parkinson disease [facility])     Past Surgical History  Procedure Laterality Date  . Back surgery  2015  . Appendectomy  1959  . Thyroidectomy  1960  . Cholecystectomy  1998    There were no vitals filed for this visit.  Visit Diagnosis:  Difficulty walking  Weakness      Subjective Assessment - 06/26/15 1502    Subjective Patient is having some unsteady gait., but feels that she is improving.    Currently in Pain? No/denies          floor to ceiling x 10 reps, side to side 10 reps, step and reach forward x 10 reps, step and reach backwards x 10, step and reach sideways x 10 , Rock and reach forward/backward x 10 , Rock and reach sideways x 10, functional tasks; 1 sit to stand 2. Reaching with cones to duplicate putting dishes away 3. hand coordination tasks 4. stepping up from foam to stool 5. washing hair and drying hair  Patient continues to demonstrates less incoordination of movement with select exercises such as rock and reach and stepping backwards Min cueing needed to appropriately perform LSVT tasks with leg, hand, and head position. Decreased coordination demonstrated  requiring consistent verbal cueing to correct form. Cognitive understanding of task was delayed. Patient continues to demonstrate some in coordination of movement with select exercises such as rock and reach and stepping backwards. Patient responds well to verbal and tactile cues to correct form and technique. CGA to SBA for safety with activities. Uses to increase intensity and amplitude of movements throughout session                        PT Education - 06/26/15 1502    Education provided Yes   Education Details LSVt BIG   Person(s) Educated Patient   Methods Explanation   Comprehension Verbalized understanding             PT Long Term Goals - 06/13/15 1517    PT LONG TERM GOAL #1   Title Patient (> 59 years old) will complete five times sit to stand test in < 15 seconds indicating an increased LE strength and improved balance10/25/16   PT LONG TERM GOAL #2   Title Patient will increase six minute walk test distance to >1000 for progression to community ambulator and improve gait ability10/25/16   PT LONG TERM GOAL #3   Title Patient will increase 10 meter walk test to >1.29m/s as to improve gait speed for better community ambulation and to reduce fall  risk. 07/18/15               Plan - 06/26/15 1504    Clinical Impression Statement CGA to SBA for safety with activities.  Uses to increase intensity and amplitude of movements throughout session   Rehab Potential Good   PT Frequency 4x / week   PT Duration 4 weeks   PT Treatment/Interventions Balance training;Therapeutic exercise;Therapeutic activities;Functional mobility training;Stair training;Gait training;ADLs/Self Care Home Management   PT Next Visit Plan LSVT BIG   Consulted and Agree with Plan of Care Patient        Problem List Patient Active Problem List   Diagnosis Date Noted  . Acquired hypothyroidism 12/23/2014  . Neuritis or radiculitis due to rupture of lumbar intervertebral disc  12/07/2014  . Lumbar canal stenosis 12/07/2014  . H/O surgical procedure 03/08/2014  . Carpal tunnel syndrome 12/03/2013  . Benign essential HTN 12/03/2013  . Degenerative joint disease involving multiple joints 12/03/2013  . Idiopathic Parkinson's disease (HCC) 12/03/2013    Ezekiel Ina 06/26/2015, 3:05 PM  [place] [facility] MAIN [facility] SERVICES [address] [place], [place], [address] Phone: [phone]   Fax:  [phone]

## 2015-06-27 ENCOUNTER — Ambulatory Visit: Payer: PPO | Admitting: Speech Pathology

## 2015-06-27 ENCOUNTER — Encounter: Payer: Self-pay | Admitting: Physical Therapy

## 2015-06-27 ENCOUNTER — Ambulatory Visit: Payer: PPO | Admitting: Physical Therapy

## 2015-06-27 DIAGNOSIS — R490 Dysphonia: Secondary | ICD-10-CM | POA: Diagnosis not present

## 2015-06-27 DIAGNOSIS — R531 Weakness: Secondary | ICD-10-CM

## 2015-06-27 DIAGNOSIS — R262 Difficulty in walking, not elsewhere classified: Secondary | ICD-10-CM

## 2015-06-27 NOTE — Therapy (Signed)
[place] [facility] MAIN [facility] SERVICES [address] [place], [place], [address] Phone: [phone]   Fax:  [phone]  Physical Therapy Treatment  Patient Details  Name: Patricia [name] MRN: [id number] Date of Birth: Jul 27, 1934 Referring Provider:  Lonell Face, MD  Encounter Date: 06/27/2015      PT End of Session - 06/27/15 1512    Visit Number 6   Number of Visits 17   Date for PT Re-Evaluation 07/18/15   Authorization Type 6   PT Start Time 0300   PT Stop Time 0355   PT Time Calculation (min) 55 min   Activity Tolerance Patient tolerated treatment well   Behavior During Therapy [facility] for tasks assessed/performed      Past Medical History  Diagnosis Date  . GERD (gastroesophageal reflux disease)   . Arthritis   . Hypertension   . Hypothyroidism   . Parkinson disease [facility])     Past Surgical History  Procedure Laterality Date  . Back surgery  2015  . Appendectomy  1959  . Thyroidectomy  1960  . Cholecystectomy  1998    There were no vitals filed for this visit.  Visit Diagnosis:  Difficulty walking  Weakness      Subjective Assessment - 06/27/15 1505    Subjective Patient is having some unsteady gait., but feels that she is improving.    Currently in Pain? No/denies          floor to ceiling x 10 reps, side to side 10 reps, step and reach forward x 10 reps, step and reach backwards x 10, step and reach sideways x 10 , Rock and reach forward/backward x 10 , Rock and reach sideways x 10, functional tasks; 1 sit to stand 2. Reaching with cones to duplicate putting dishes away 3. hand coordination tasks 4. stepping up from foam to stool 5. washing hair and drying hair  Patient continues to demonstrates less incoordination of movement with select exercises such as rock and reach and stepping backwards Min cueing needed to appropriately perform LSVT tasks with leg, hand, and head position. Decreased coordination demonstrated  requiring consistent verbal cueing to correct form. Cognitive understanding of task was delayed. Patient continues to demonstrate some in coordination of movement with select exercises such as rock and reach and stepping backwards. Patient responds well to verbal and tactile cues to correct form and technique. CGA to SBA for safety with activities. Uses to increase intensity and amplitude of movements throughout session                        PT Education - 06/27/15 1511    Education provided Yes   Education Details LSVT BIG   Person(s) Educated Patient   Methods Explanation   Comprehension Verbalized understanding             PT Long Term Goals - 06/13/15 1517    PT LONG TERM GOAL #1   Title Patient (> 52 years old) will complete five times sit to stand test in < 15 seconds indicating an increased LE strength and improved balance10/25/16   PT LONG TERM GOAL #2   Title Patient will increase six minute walk test distance to >1000 for progression to community ambulator and improve gait ability10/25/16   PT LONG TERM GOAL #3   Title Patient will increase 10 meter walk test to >1.64m/s as to improve gait speed for better community ambulation and to reduce fall  risk. 07/18/15               Plan - 06/27/15 1513    Clinical Impression Statement Patient continues to demonstrates less incoordination of movement with select exercises such as rock and reach and stepping backwards. Patient responds well to verbal and tactile cues to correct form and technique. Patient is able to catch mistakes in technique with incorrect positions and is able remember the start and finish positions. Motor control of LE much improved.  Muscle fatigue but no major pain complaints.   Pt will benefit from skilled therapeutic intervention in order to improve on the following deficits Abnormal gait;Difficulty walking;Decreased activity tolerance;Decreased balance;Decreased strength;Decreased  mobility;Decreased coordination   Rehab Potential Good   PT Frequency 4x / week   PT Duration 4 weeks   PT Treatment/Interventions Balance training;Therapeutic exercise;Therapeutic activities;Functional mobility training;Stair training;Gait training;ADLs/Self Care Home Management   PT Next Visit Plan LSVT BIG   Consulted and Agree with Plan of Care Patient        Problem List Patient Active Problem List   Diagnosis Date Noted  . Acquired hypothyroidism 12/23/2014  . Neuritis or radiculitis due to rupture of lumbar intervertebral disc 12/07/2014  . Lumbar canal stenosis 12/07/2014  . H/O surgical procedure 03/08/2014  . Carpal tunnel syndrome 12/03/2013  . Benign essential HTN 12/03/2013  . Degenerative joint disease involving multiple joints 12/03/2013  . Idiopathic Parkinson's disease (HCC) 12/03/2013    Ezekiel Ina 06/27/2015, 3:16 PM  [place] [facility] MAIN [facility] SERVICES [address] [place], [place], [address] Phone: [phone]   Fax:  [phone]

## 2015-06-27 NOTE — Therapy (Signed)
[place] [facility] MAIN [facility] SERVICES [address] [place], [place], [address] Phone: [phone]   Fax:  [phone]  Physical Therapy Evaluation  Patient Details  Name: Patricia [name] MRN: [id number] Date of Birth: 1934/09/08 Referring Provider:  Lonell Face, MD  Encounter Date: 06/27/2015      PT End of Session - 06/27/15 1512    Visit Number 6   Number of Visits 17   Date for PT Re-Evaluation 07/18/15   Authorization Type 6   PT Start Time 0300   PT Stop Time 0355   PT Time Calculation (min) 55 min   Activity Tolerance Patient tolerated treatment well   Behavior During Therapy [facility] for tasks assessed/performed      Past Medical History  Diagnosis Date  . GERD (gastroesophageal reflux disease)   . Arthritis   . Hypertension   . Hypothyroidism   . Parkinson disease [facility])     Past Surgical History  Procedure Laterality Date  . Back surgery  2015  . Appendectomy  1959  . Thyroidectomy  1960  . Cholecystectomy  1998    There were no vitals filed for this visit.  Visit Diagnosis:  Difficulty walking  Weakness      Subjective Assessment - 06/27/15 1505    Subjective Patient is having some unsteady gait., but feels that she is improving.    Currently in Pain? No/denies                               PT Education - 06/27/15 1511    Education provided Yes   Education Details LSVT BIG   Person(s) Educated Patient   Methods Explanation   Comprehension Verbalized understanding             PT Long Term Goals - 06/13/15 1517    PT LONG TERM GOAL #1   Title Patient (> 79 years old) will complete five times sit to stand test in < 15 seconds indicating an increased LE strength and improved balance10/25/16   PT LONG TERM GOAL #2   Title Patient will increase six minute walk test distance to >1000 for progression to community ambulator and improve gait ability10/25/16   PT LONG TERM GOAL #3   Title  Patient will increase 10 meter walk test to >1.88m/s as to improve gait speed for better community ambulation and to reduce fall risk. 07/18/15               Plan - 06/27/15 1513    Clinical Impression Statement Patient continues to demonstrates less incoordination of movement with select exercises such as rock and reach and stepping backwards. Patient responds well to verbal and tactile cues to correct form and technique. Patient is able to catch mistakes in technique with incorrect positions and is able remember the start and finish positions. Motor control of LE much improved.  Muscle fatigue but no major pain complaints.   Pt will benefit from skilled therapeutic intervention in order to improve on the following deficits Abnormal gait;Difficulty walking;Decreased activity tolerance;Decreased balance;Decreased strength;Decreased mobility;Decreased coordination   Rehab Potential Good   PT Frequency 4x / week   PT Duration 4 weeks   PT Treatment/Interventions Balance training;Therapeutic exercise;Therapeutic activities;Functional mobility training;Stair training;Gait training;ADLs/Self Care Home Management   PT Next Visit Plan LSVT BIG   Consulted and Agree with Plan of Care Patient         Problem  List Patient Active Problem List   Diagnosis Date Noted  . Acquired hypothyroidism 12/23/2014  . Neuritis or radiculitis due to rupture of lumbar intervertebral disc 12/07/2014  . Lumbar canal stenosis 12/07/2014  . H/O surgical procedure 03/08/2014  . Carpal tunnel syndrome 12/03/2013  . Benign essential HTN 12/03/2013  . Degenerative joint disease involving multiple joints 12/03/2013  . Idiopathic Parkinson's disease (HCC) 12/03/2013    Ezekiel Ina 06/27/2015, 3:57 PM  [place] [facility] MAIN [facility] SERVICES [address] [place], [place], [address] Phone: [phone]   Fax:  [phone]

## 2015-06-28 ENCOUNTER — Encounter: Payer: Self-pay | Admitting: Speech Pathology

## 2015-06-28 ENCOUNTER — Other Ambulatory Visit: Payer: Self-pay | Admitting: Internal Medicine

## 2015-06-28 ENCOUNTER — Encounter: Payer: Self-pay | Admitting: Physical Therapy

## 2015-06-28 ENCOUNTER — Ambulatory Visit: Payer: PPO | Admitting: Physical Therapy

## 2015-06-28 ENCOUNTER — Ambulatory Visit: Payer: PPO | Admitting: Speech Pathology

## 2015-06-28 DIAGNOSIS — R262 Difficulty in walking, not elsewhere classified: Secondary | ICD-10-CM

## 2015-06-28 DIAGNOSIS — R490 Dysphonia: Secondary | ICD-10-CM | POA: Diagnosis not present

## 2015-06-28 DIAGNOSIS — Z1231 Encounter for screening mammogram for malignant neoplasm of breast: Secondary | ICD-10-CM

## 2015-06-28 DIAGNOSIS — R531 Weakness: Secondary | ICD-10-CM

## 2015-06-28 NOTE — Therapy (Signed)
[place] [facility] MAIN [facility] SERVICES [address] [place], [place], [address] Phone: [phone]   Fax:  [phone]  Speech Language Pathology Treatment  Patient Details  Name: Patricia [name] MRN: [id number] Date of Birth: 1933-11-15 Referring Provider:  Lonell Face, MD  Encounter Date: 06/28/2015      End of Session - 06/28/15 1447    Visit Number 7   Number of Visits 17   Date for SLP Re-Evaluation 07/21/15   SLP Start Time 1350   SLP Stop Time  1443   SLP Time Calculation (min) 53 min      Past Medical History  Diagnosis Date  . GERD (gastroesophageal reflux disease)   . Arthritis   . Hypertension   . Hypothyroidism   . Parkinson disease [facility])     Past Surgical History  Procedure Laterality Date  . Back surgery  2015  . Appendectomy  1959  . Thyroidectomy  1960  . Cholecystectomy  1998    There were no vitals filed for this visit.  Visit Diagnosis: Dysphonia      Subjective Assessment - 06/28/15 1446    Subjective The patient reports that family are not asking her to speak up or repeat herself as often.   Currently in Pain? No/denies               ADULT SLP TREATMENT - 06/28/15 1445    General Information   Behavior/Cognition Alert;Cooperative;Pleasant mood   Treatment Provided   Treatment provided Cognitive-Linquistic   Pain Assessment   Pain Assessment No/denies pain   Cognitive-Linquistic Treatment   Treatment focused on Voice   Skilled Treatment Daily Task #1: Average 7 seconds, 77 dB. Daily Task 2: Highs: 15 high pitched "ah" given mod cues. Lows: 15 low pitched "ah" given mod cues. Daily task #3: Average 71 dB.  Hierarchal speech loudness drill: Read sentences, 71 dB. Homework: assignments completed.  Off the cuff remarks: average 68 dB.   Assessment / Recommendations / Plan   Plan Continue with current plan of care   Progression Toward Goals   Progression toward goals Progressing toward goals           SLP Education - 06/28/15 1446    Education provided Yes   Education Details LSVT-LOUD   Person(s) Educated Patient   Methods Explanation;Demonstration;Verbal cues;Handout   Comprehension Verbalized understanding;Returned demonstration;Verbal cues required;Need further instruction            SLP Long Term Goals - 06/14/15 1131    SLP LONG TERM GOAL #1   Title The patient will complete Daily Tasks (Maximum duration "ah", High/Lows, and Functional Phrases) at average loudness of 80 dB and with loud, good quality voice.    Time 4   Period Weeks   Status New   SLP LONG TERM GOAL #2   Title The patient will complete Hierarchal Speech Loudness reading drills (words/phrases, sentences, and paragraph) at average 75 dB and with loud, good quality voice.     Time 4   Period Weeks   Status New   SLP LONG TERM GOAL #3   Title The patient will complete homework daily.   Time 4   Period Weeks   Status New   SLP LONG TERM GOAL #4   Title The patient will participate in conversation, maintaining average loudness of 75 dB and loud, good quality voice.   Time 4   Period Weeks   Status New  Plan - 06/28/15 1447    Clinical Impression Statement The patient is completing daily tasks and hierarchal speech drill tasks with loud, good quality voice given fewer SLP cues for loudness and quality.  She is demonstrating emerging generalization into conversational speech.   Speech Therapy Frequency 4x / week   Duration 4 weeks   Treatment/Interventions Other (comment)  LSVT-LOUD   Potential to Achieve Goals Good   Potential Considerations Ability to learn/carryover information;Cooperation/participation level;Previous level of function;Severity of impairments;Other (comment)   SLP Home Exercise Plan LSVT-LOUD daily homework   Consulted and Agree with Plan of Care Patient        Problem List Patient Active Problem List   Diagnosis Date Noted  . Acquired hypothyroidism  12/23/2014  . Neuritis or radiculitis due to rupture of lumbar intervertebral disc 12/07/2014  . Lumbar canal stenosis 12/07/2014  . H/O surgical procedure 03/08/2014  . Carpal tunnel syndrome 12/03/2013  . Benign essential HTN 12/03/2013  . Degenerative joint disease involving multiple joints 12/03/2013  . Idiopathic Parkinson's disease (HCC) 12/03/2013   Dollene Primrose, MS/CCC- SLP  Leandrew Koyanagi 06/28/2015, 2:49 PM  [place] [facility] MAIN [facility] SERVICES [address] [place], [place], [address] Phone: [phone]   Fax:  [phone]

## 2015-06-28 NOTE — Therapy (Signed)
[employer] [facility] MAIN [facility] SERVICES [address] [place], [place], [address] Phone: [phone]   Fax:  [phone]  Physical Therapy Treatment  Patient Details  Name: Patricia [name] MRN: [id number] Date of Birth: 02-01-1934 Referring Provider:  Lonell Face, MD  Encounter Date: 06/28/2015      PT End of Session - 06/28/15 1512    Visit Number 7   Number of Visits 17   Date for PT Re-Evaluation 07/18/15   PT Start Time 0300   PT Stop Time 0400   PT Time Calculation (min) 60 min   Activity Tolerance Patient tolerated treatment well      Past Medical History  Diagnosis Date  . GERD (gastroesophageal reflux disease)   . Arthritis   . Hypertension   . Hypothyroidism   . Parkinson disease [facility])     Past Surgical History  Procedure Laterality Date  . Back surgery  2015  . Appendectomy  1959  . Thyroidectomy  1960  . Cholecystectomy  1998    There were no vitals filed for this visit.  Visit Diagnosis:  Difficulty walking  Weakness      Subjective Assessment - 06/28/15 1512    Currently in Pain? No/denies      floor to ceiling x 10 reps, side to side 10 reps, step and reach forward x 10 reps, step and reach backwards x 10, step and reach sideways x 10 , Rock and reach forward/backward x 10 , Rock and reach sideways x 10, functional tasks; 1 sit to stand 2. Reaching with cones to duplicate putting dishes away 3. hand coordination tasks 4. stepping up from foam to stool 5. washing hair and drying hair  Patient continues to demonstrates less incoordination of movement with select exercises such as rock and reach and stepping backwards Min cueing needed to appropriately perform LSVT tasks with leg, hand, and head position. Decreased coordination demonstrated requiring consistent verbal cueing to correct form. Cognitive understanding of task was delayed. Patient continues to demonstrate some in coordination of movement with select  exercises such as rock and reach and stepping backwards. Patient responds well to verbal and tactile cues to correct form and technique. CGA to SBA for safety with activities. Uses to increase intensity and amplitude of movements throughout session                           PT Education - 06/28/15 1512    Education provided Yes   Education Details LSVT BIG   Person(s) Educated Patient   Methods Explanation             PT Long Term Goals - 06/13/15 1517    PT LONG TERM GOAL #1   Title Patient (> 4 years old) will complete five times sit to stand test in < 15 seconds indicating an increased LE strength and improved balance10/25/16   PT LONG TERM GOAL #2   Title Patient will increase six minute walk test distance to >1000 for progression to community ambulator and improve gait ability10/25/16   PT LONG TERM GOAL #3   Title Patient will increase 10 meter walk test to >1.39m/s as to improve gait speed for better community ambulation and to reduce fall risk. 07/18/15               Plan - 06/28/15 1513    Clinical Impression Statement CGA to SBA for safety with activities.  Uses to  increase intensity and amplitude of movements throughout session.   Pt will benefit from skilled therapeutic intervention in order to improve on the following deficits Abnormal gait;Difficulty walking;Decreased activity tolerance;Decreased balance;Decreased strength;Decreased mobility;Decreased coordination   Rehab Potential Good   PT Frequency 4x / week   PT Duration 4 weeks   PT Treatment/Interventions Balance training;Therapeutic exercise;Therapeutic activities;Functional mobility training;Stair training;Gait training;ADLs/Self Care Home Management   PT Next Visit Plan LSVT BIG   Consulted and Agree with Plan of Care Patient        Problem List Patient Active Problem List   Diagnosis Date Noted  . Acquired hypothyroidism 12/23/2014  . Neuritis or radiculitis due to  rupture of lumbar intervertebral disc 12/07/2014  . Lumbar canal stenosis 12/07/2014  . H/O surgical procedure 03/08/2014  . Carpal tunnel syndrome 12/03/2013  . Benign essential HTN 12/03/2013  . Degenerative joint disease involving multiple joints 12/03/2013  . Idiopathic Parkinson's disease (HCC) 12/03/2013    Ezekiel Ina 06/28/2015, 3:15 PM  [place] [facility] MAIN [facility] SERVICES [address] [place], [place], [address] Phone: [phone]   Fax:  [phone]

## 2015-06-28 NOTE — Therapy (Signed)
[facility] MAIN [facility] SERVICES [address] [place], [place], [address] Phone: [phone]   Fax:  [phone]  Speech Language Pathology Treatment  Patient Details  Name: Patricia [name] MRN: [id number] Date of Birth: Dec 10, 1933 Referring Provider:  Lonell Face, MD  Encounter Date: 06/27/2015      End of Session - 06/28/15 0923    Visit Number 6   Number of Visits 17   Date for SLP Re-Evaluation 07/21/15   SLP Start Time 1400   SLP Stop Time  1454   SLP Time Calculation (min) 54 min   Activity Tolerance Patient tolerated treatment well      Past Medical History  Diagnosis Date  . GERD (gastroesophageal reflux disease)   . Arthritis   . Hypertension   . Hypothyroidism   . Parkinson disease [facility])     Past Surgical History  Procedure Laterality Date  . Back surgery  2015  . Appendectomy  1959  . Thyroidectomy  1960  . Cholecystectomy  1998    There were no vitals filed for this visit.  Visit Diagnosis: Dysphonia      Subjective Assessment - 06/28/15 0922    Subjective The patient reports that family are not asking her to speak up or repeat herself as often.   Currently in Pain? No/denies               ADULT SLP TREATMENT - 06/28/15 0001    General Information   Behavior/Cognition Alert;Cooperative;Pleasant mood   Treatment Provided   Treatment provided Cognitive-Linquistic   Pain Assessment   Pain Assessment No/denies pain   Cognitive-Linquistic Treatment   Treatment focused on Voice   Skilled Treatment Daily Task #1: Average 6 seconds, 77 dB. Daily Task 2: Highs: 15 high pitched "ah" given mod cues. Lows: 15 low pitched "ah" given mod cues. Daily task #3: Average 71 dB.  Hierarchal speech loudness drill: Read sentences, 71 dB. Homework: assignments completed.  Off the cuff remarks: average 68 dB.   Assessment / Recommendations / Plan   Plan Continue with current plan of care   Progression Toward Goals    Progression toward goals Progressing toward goals          SLP Education - 06/28/15 0922    Education provided Yes   Education Details LSVT-LOUD   Person(s) Educated Patient   Methods Explanation;Demonstration;Verbal cues;Handout   Comprehension Verbalized understanding;Returned demonstration;Verbal cues required;Need further instruction            SLP Long Term Goals - 06/14/15 1131    SLP LONG TERM GOAL #1   Title The patient will complete Daily Tasks (Maximum duration "ah", High/Lows, and Functional Phrases) at average loudness of 80 dB and with loud, good quality voice.    Time 4   Period Weeks   Status New   SLP LONG TERM GOAL #2   Title The patient will complete Hierarchal Speech Loudness reading drills (words/phrases, sentences, and paragraph) at average 75 dB and with loud, good quality voice.     Time 4   Period Weeks   Status New   SLP LONG TERM GOAL #3   Title The patient will complete homework daily.   Time 4   Period Weeks   Status New   SLP LONG TERM GOAL #4   Title The patient will participate in conversation, maintaining average loudness of 75 dB and loud, good quality voice.   Time 4   Period Weeks  Status New          Plan - 06/28/15 0924    Clinical Impression Statement The patient is completing daily tasks and hierarchal speech drill tasks with loud, good quality voice given fewer SLP cues for loudness and quality.  She is demonstrating emerging generalization into conversational speech.   Speech Therapy Frequency 4x / week   Duration 4 weeks   Treatment/Interventions Other (comment)  LSVT-LOUD   Potential to Achieve Goals Good   Potential Considerations Ability to learn/carryover information;Cooperation/participation level;Previous level of function;Severity of impairments;Other (comment)   SLP Home Exercise Plan LSVT-LOUD daily homework   Consulted and Agree with Plan of Care Patient        Problem List Patient Active Problem List    Diagnosis Date Noted  . Acquired hypothyroidism 12/23/2014  . Neuritis or radiculitis due to rupture of lumbar intervertebral disc 12/07/2014  . Lumbar canal stenosis 12/07/2014  . H/O surgical procedure 03/08/2014  . Carpal tunnel syndrome 12/03/2013  . Benign essential HTN 12/03/2013  . Degenerative joint disease involving multiple joints 12/03/2013  . Idiopathic Parkinson's disease (HCC) 12/03/2013   Dollene Primrose, MS/CCC- SLP  Leandrew Koyanagi 06/28/2015, 9:25 AM  [place] [facility] MAIN [facility] SERVICES [address] [place], [place], [address] Phone: [phone]   Fax:  [phone]

## 2015-06-29 ENCOUNTER — Encounter: Payer: Self-pay | Admitting: Physical Therapy

## 2015-06-29 ENCOUNTER — Ambulatory Visit: Payer: PPO | Admitting: Speech Pathology

## 2015-06-29 ENCOUNTER — Ambulatory Visit: Payer: PPO | Admitting: Physical Therapy

## 2015-06-29 DIAGNOSIS — R490 Dysphonia: Secondary | ICD-10-CM

## 2015-06-29 DIAGNOSIS — R531 Weakness: Secondary | ICD-10-CM

## 2015-06-29 DIAGNOSIS — R262 Difficulty in walking, not elsewhere classified: Secondary | ICD-10-CM

## 2015-06-29 NOTE — Therapy (Signed)
[place] [facility] MAIN [facility] SERVICES [address] [place], [place], [address] Phone: [phone]   Fax:  [phone]  Physical Therapy Treatment  Patient Details  Name: Patricia [name] MRN: [id number] Date of Birth: 03-05-1934 Referring Provider:  Lonell Face, MD  Encounter Date: 06/29/2015      PT End of Session - 06/29/15 1513    Visit Number 8   Number of Visits 17   Date for PT Re-Evaluation 07/18/15   Authorization Type 8   PT Start Time 0300   PT Stop Time 0355   PT Time Calculation (min) 55 min   Activity Tolerance Patient tolerated treatment well      Past Medical History  Diagnosis Date  . GERD (gastroesophageal reflux disease)   . Arthritis   . Hypertension   . Hypothyroidism   . Parkinson disease [facility])     Past Surgical History  Procedure Laterality Date  . Back surgery  2015  . Appendectomy  1959  . Thyroidectomy  1960  . Cholecystectomy  1998    There were no vitals filed for this visit.  Visit Diagnosis:  Difficulty walking  Weakness      Subjective Assessment - 06/29/15 1513    Subjective Patient is having some unsteady gait., but feels that she is improving.    Currently in Pain? No/denies        floor to ceiling x 10 reps, side to side 10 reps, step and reach forward x 10 reps, step and reach backwards x 10, step and reach sideways x 10 , Rock and reach forward/backward x 10 , Rock and reach sideways x 10, functional tasks; 1 sit to stand 2. Reaching with cones to duplicate putting dishes away 3. hand coordination tasks 4. stepping up from foam to stool 5. washing hair and drying hair  Patient continues to demonstrates less incoordination of movement with select exercises such as rock and reach and stepping backwards Min cueing needed to appropriately perform LSVT tasks with leg, hand, and head position. Decreased coordination demonstrated requiring consistent verbal cueing to correct form. Cognitive  understanding of task was delayed. Patient continues to demonstrate some in coordination of movement with select exercises such as rock and reach and stepping backwards. Patient responds well to verbal and tactile cues to correct form and technique. CGA to SBA for safety with activities. Uses to increase intensity and amplitude of movements throughout session                          PT Education - 06/29/15 1513    Education provided Yes   Education Details LSVT BIG   Person(s) Educated Patient   Methods Explanation   Comprehension Verbalized understanding             PT Long Term Goals - 06/13/15 1517    PT LONG TERM GOAL #1   Title Patient (> 75 years old) will complete five times sit to stand test in < 15 seconds indicating an increased LE strength and improved balance10/25/16   PT LONG TERM GOAL #2   Title Patient will increase six minute walk test distance to >1000 for progression to community ambulator and improve gait ability10/25/16   PT LONG TERM GOAL #3   Title Patient will increase 10 meter walk test to >1.39m/s as to improve gait speed for better community ambulation and to reduce fall risk. 07/18/15  Plan - 06/29/15 1513    Clinical Impression Statement Miin cueing needed to appropriately perform LSVT tasks with leg, hand, and head position.   Pt will benefit from skilled therapeutic intervention in order to improve on the following deficits Abnormal gait;Difficulty walking;Decreased activity tolerance;Decreased balance;Decreased strength;Decreased mobility;Decreased coordination   Rehab Potential Good   PT Frequency 4x / week   PT Duration 4 weeks   PT Treatment/Interventions Balance training;Therapeutic exercise;Therapeutic activities;Functional mobility training;Stair training;Gait training;ADLs/Self Care Home Management   PT Next Visit Plan LSVT BIG   Consulted and Agree with Plan of Care Patient        Problem  List Patient Active Problem List   Diagnosis Date Noted  . Acquired hypothyroidism 12/23/2014  . Neuritis or radiculitis due to rupture of lumbar intervertebral disc 12/07/2014  . Lumbar canal stenosis 12/07/2014  . H/O surgical procedure 03/08/2014  . Carpal tunnel syndrome 12/03/2013  . Benign essential HTN 12/03/2013  . Degenerative joint disease involving multiple joints 12/03/2013  . Idiopathic Parkinson's disease (HCC) 12/03/2013    Ezekiel Ina 06/29/2015, 3:15 PM  [place] [facility] MAIN [facility] SERVICES [address] [place], [place], [address] Phone: [phone]   Fax:  [phone]

## 2015-06-30 ENCOUNTER — Encounter: Payer: Self-pay | Admitting: Speech Pathology

## 2015-06-30 NOTE — Therapy (Signed)
[place] [facility] MAIN [facility] SERVICES [address] [place], [place], [address] Phone: [phone]   Fax:  [phone]  Speech Language Pathology Treatment  Patient Details  Name: Patricia [name] MRN: [id number] Date of Birth: August 23, 1934 Referring Provider:  Lonell Face, MD  Encounter Date: 06/29/2015      End of Session - 06/30/15 1019    Visit Number 8   Number of Visits 17   Date for SLP Re-Evaluation 07/21/15   SLP Start Time 1400   SLP Stop Time  1456   SLP Time Calculation (min) 56 min   Activity Tolerance Patient tolerated treatment well      Past Medical History  Diagnosis Date  . GERD (gastroesophageal reflux disease)   . Arthritis   . Hypertension   . Hypothyroidism   . Parkinson disease [facility])     Past Surgical History  Procedure Laterality Date  . Back surgery  2015  . Appendectomy  1959  . Thyroidectomy  1960  . Cholecystectomy  1998    There were no vitals filed for this visit.  Visit Diagnosis: Dysphonia      Subjective Assessment - 06/30/15 1018    Subjective The patient reports that family are not asking her to speak up or repeat herself as often.   Currently in Pain? No/denies               ADULT SLP TREATMENT - 06/30/15 0001    General Information   Behavior/Cognition Alert;Cooperative;Pleasant mood   Treatment Provided   Treatment provided Cognitive-Linquistic   Pain Assessment   Pain Assessment No/denies pain   Cognitive-Linquistic Treatment   Treatment focused on Voice   Skilled Treatment Daily Task #1: Average 7 seconds, 80 dB. Daily Task 2: Highs: 15 high pitched "ah" given mod cues. Lows: 15 low pitched "ah" given mod cues. Daily task #3: Average 72 dB.  Hierarchal speech loudness drill: Read paragraphs, 69 dB. Generate sentence given linguistic task, 71 dB.  Homework: assignments completed.  Off the cuff remarks: average 68 dB.   Assessment / Recommendations / Plan   Plan Continue with  current plan of care   Progression Toward Goals   Progression toward goals Progressing toward goals          SLP Education - 06/30/15 1018    Education provided Yes   Education Details LSVT-LOUD   Person(s) Educated Patient   Methods Explanation;Demonstration;Verbal cues;Handout   Comprehension Verbalized understanding;Returned demonstration;Verbal cues required;Need further instruction            SLP Long Term Goals - 06/14/15 1131    SLP LONG TERM GOAL #1   Title The patient will complete Daily Tasks (Maximum duration "ah", High/Lows, and Functional Phrases) at average loudness of 80 dB and with loud, good quality voice.    Time 4   Period Weeks   Status New   SLP LONG TERM GOAL #2   Title The patient will complete Hierarchal Speech Loudness reading drills (words/phrases, sentences, and paragraph) at average 75 dB and with loud, good quality voice.     Time 4   Period Weeks   Status New   SLP LONG TERM GOAL #3   Title The patient will complete homework daily.   Time 4   Period Weeks   Status New   SLP LONG TERM GOAL #4   Title The patient will participate in conversation, maintaining average loudness of 75 dB and loud, good quality voice.  Time 4   Period Weeks   Status New          Plan - 06/30/15 1020    Clinical Impression Statement The patient is completing daily tasks and hierarchal speech drill tasks with loud, good quality voice given fewer SLP cues for loudness and quality.  She is demonstrating emerging generalization into conversational speech.   Speech Therapy Frequency 4x / week   Duration 4 weeks   Treatment/Interventions Other (comment)  LSVT-LOUD   Potential to Achieve Goals Good   Potential Considerations Ability to learn/carryover information;Cooperation/participation level;Previous level of function;Severity of impairments;Other (comment)   SLP Home Exercise Plan LSVT-LOUD daily homework   Consulted and Agree with Plan of Care Patient         Problem List Patient Active Problem List   Diagnosis Date Noted  . Acquired hypothyroidism 12/23/2014  . Neuritis or radiculitis due to rupture of lumbar intervertebral disc 12/07/2014  . Lumbar canal stenosis 12/07/2014  . H/O surgical procedure 03/08/2014  . Carpal tunnel syndrome 12/03/2013  . Benign essential HTN 12/03/2013  . Degenerative joint disease involving multiple joints 12/03/2013  . Idiopathic Parkinson's disease (HCC) 12/03/2013   Patricia Primrose, MS/CCC- SLP  Leandrew Koyanagi 06/30/2015, 10:21 AM  [place] [facility] MAIN [facility] SERVICES [address] [place], [place], [address] Phone: [phone]   Fax:  [phone]

## 2015-07-03 ENCOUNTER — Ambulatory Visit: Payer: PPO | Admitting: Physical Therapy

## 2015-07-03 ENCOUNTER — Encounter: Payer: Self-pay | Admitting: Physical Therapy

## 2015-07-03 ENCOUNTER — Ambulatory Visit: Payer: PPO | Admitting: Speech Pathology

## 2015-07-03 ENCOUNTER — Encounter: Payer: Self-pay | Admitting: Speech Pathology

## 2015-07-03 DIAGNOSIS — R531 Weakness: Secondary | ICD-10-CM

## 2015-07-03 DIAGNOSIS — R262 Difficulty in walking, not elsewhere classified: Secondary | ICD-10-CM

## 2015-07-03 DIAGNOSIS — R490 Dysphonia: Secondary | ICD-10-CM

## 2015-07-03 NOTE — Therapy (Signed)
[place] [facility] MAIN [facility] SERVICES [address] [place], [place], [address] Phone: [phone]   Fax:  [phone]  Physical Therapy Treatment  Patient Details  Name: Patricia [name] MRN: [id number] Date of Birth: Nov 18, 1933 Referring Provider:  Lonell Face, MD  Encounter Date: 07/03/2015      PT End of Session - 07/03/15 1459    Visit Number 9   Authorization Type 9   PT Start Time 0300   PT Stop Time 0400   PT Time Calculation (min) 60 min   Activity Tolerance Patient tolerated treatment well      Past Medical History  Diagnosis Date  . GERD (gastroesophageal reflux disease)   . Arthritis   . Hypertension   . Hypothyroidism   . Parkinson disease [facility])     Past Surgical History  Procedure Laterality Date  . Back surgery  2015  . Appendectomy  1959  . Thyroidectomy  1960  . Cholecystectomy  1998    There were no vitals filed for this visit.  Visit Diagnosis:  Difficulty walking  Weakness      Subjective Assessment - 07/03/15 1458    Subjective Patient is having some unsteady gait., but feels that she is improving.    Currently in Pain? No/denies           floor to ceiling x 10 reps, side to side 10 reps, step and reach forward x 10 reps, step and reach backwards x 10, step and reach sideways x 10 , Rock and reach forward/backward x 10 , Rock and reach sideways x 10, functional tasks; 1 sit to stand 2. Reaching with cones to duplicate putting dishes away 3. hand coordination tasks 4. stepping up from foam to stool 5. washing hair and drying hair  Patient continues to demonstrates less incoordination of movement with select exercises such as rock and reach and stepping backwards Min cueing needed to appropriately perform LSVT tasks with leg, hand, and head position. Decreased coordination demonstrated requiring consistent verbal cueing to correct form. Cognitive understanding of task was delayed. Patient continues to  demonstrate some in coordination of movement with select exercises such as rock and reach and stepping backwards. Patient responds well to verbal and tactile cues to correct form and technique. CGA to SBA for safety with activities. Uses to increase intensity and amplitude of movements throughout session                          PT Education - 07/03/15 1459    Education provided Yes   Education Details LSVT BIG   Person(s) Educated Patient   Methods Explanation   Comprehension Verbalized understanding             PT Long Term Goals - 06/13/15 1517    PT LONG TERM GOAL #1   Title Patient (> 46 years old) will complete five times sit to stand test in < 15 seconds indicating an increased LE strength and improved balance10/25/16   PT LONG TERM GOAL #2   Title Patient will increase six minute walk test distance to >1000 for progression to community ambulator and improve gait ability10/25/16   PT LONG TERM GOAL #3   Title Patient will increase 10 meter walk test to >1.78m/s as to improve gait speed for better community ambulation and to reduce fall risk. 07/18/15               Plan - 07/03/15  1500    Clinical Impression Statement  Patients performance improves with practice and she uses UE to help support and for balance.Pt able to perform large steps for ~5-10 seconds without cues before returning to more shuffled steps,pt continues to require modeling and tactile cuing for head rotation with exercise #7. rock and reach sideways and for leg extension with exercise #2 side to side..   Pt will benefit from skilled therapeutic intervention in order to improve on the following deficits Abnormal gait;Difficulty walking;Decreased activity tolerance;Decreased balance;Decreased strength;Decreased mobility;Decreased coordination   Rehab Potential Good   PT Frequency 4x / week   PT Duration 4 weeks   PT Treatment/Interventions Balance training;Therapeutic  exercise;Therapeutic activities;Functional mobility training;Stair training;Gait training;ADLs/Self Care Home Management   PT Next Visit Plan LSVT BIG   Consulted and Agree with Plan of Care Patient        Problem List Patient Active Problem List   Diagnosis Date Noted  . Acquired hypothyroidism 12/23/2014  . Neuritis or radiculitis due to rupture of lumbar intervertebral disc 12/07/2014  . Lumbar canal stenosis 12/07/2014  . H/O surgical procedure 03/08/2014  . Carpal tunnel syndrome 12/03/2013  . Benign essential HTN 12/03/2013  . Degenerative joint disease involving multiple joints 12/03/2013  . Idiopathic Parkinson's disease (HCC) 12/03/2013    Ezekiel Ina 07/03/2015, 3:02 PM  [facility] MAIN [facility] SERVICES [address] [place], [place], [address] Phone: [phone]   Fax:  [phone]

## 2015-07-03 NOTE — Therapy (Signed)
[place] [facility] MAIN [facility] SERVICES [address] [place], [place], [address] Phone: [phone]   Fax:  [phone]  Speech Language Pathology Treatment  Patient Details  Name: Patricia [name] MRN: [id number] Date of Birth: 07/27/1934 Referring Provider:  Lonell Face, MD  Encounter Date: 07/03/2015      End of Session - 07/03/15 1453    Visit Number 9   Number of Visits 17   Date for SLP Re-Evaluation 07/21/15   SLP Start Time 1400   SLP Stop Time  1455   SLP Time Calculation (min) 55 min   Activity Tolerance Patient tolerated treatment well      Past Medical History  Diagnosis Date  . GERD (gastroesophageal reflux disease)   . Arthritis   . Hypertension   . Hypothyroidism   . Parkinson disease [facility])     Past Surgical History  Procedure Laterality Date  . Back surgery  2015  . Appendectomy  1959  . Thyroidectomy  1960  . Cholecystectomy  1998    There were no vitals filed for this visit.  Visit Diagnosis: Dysphonia      Subjective Assessment - 07/03/15 1453    Subjective The patient reports that family are not asking her to speak up or repeat herself as often.   Currently in Pain? No/denies               ADULT SLP TREATMENT - 07/03/15 0001    General Information   Behavior/Cognition Alert;Cooperative;Pleasant mood   Treatment Provided   Treatment provided Cognitive-Linquistic   Pain Assessment   Pain Assessment No/denies pain   Cognitive-Linquistic Treatment   Treatment focused on Voice   Skilled Treatment Daily Task #1: Average 7 seconds, 78 dB. Daily Task 2: Highs: 15 high pitched "ah" given min-mod cues. Lows: 15 low pitched "ah" given min-mod cues. Daily task #3: Average 72 dB.  Hierarchal speech loudness drill: Read paragraphs, 69 dB. Generate sentence given linguistic task, 71 dB.  Homework: assignments completed.  Off the cuff remarks: average 68 dB.   Assessment / Recommendations / Plan   Plan  Continue with current plan of care   Progression Toward Goals   Progression toward goals Progressing toward goals          SLP Education - 07/03/15 1453    Education provided Yes   Education Details LSVT-LOUD   Person(s) Educated Patient   Methods Explanation;Demonstration;Verbal cues;Handout   Comprehension Verbalized understanding;Returned demonstration;Verbal cues required;Need further instruction            SLP Long Term Goals - 06/14/15 1131    SLP LONG TERM GOAL #1   Title The patient will complete Daily Tasks (Maximum duration "ah", High/Lows, and Functional Phrases) at average loudness of 80 dB and with loud, good quality voice.    Time 4   Period Weeks   Status New   SLP LONG TERM GOAL #2   Title The patient will complete Hierarchal Speech Loudness reading drills (words/phrases, sentences, and paragraph) at average 75 dB and with loud, good quality voice.     Time 4   Period Weeks   Status New   SLP LONG TERM GOAL #3   Title The patient will complete homework daily.   Time 4   Period Weeks   Status New   SLP LONG TERM GOAL #4   Title The patient will participate in conversation, maintaining average loudness of 75 dB and loud, good quality voice.  Time 4   Period Weeks   Status New          Plan - 07/03/15 1455    Clinical Impression Statement The patient is completing daily tasks and hierarchal speech drill tasks with loud, good quality voice given fewer SLP cues for loudness and quality.  She is demonstrating emerging generalization into conversational speech.   Speech Therapy Frequency 4x / week   Duration 4 weeks   Treatment/Interventions Other (comment)  LSVT-LOUD   Potential to Achieve Goals Good   Potential Considerations Ability to learn/carryover information;Cooperation/participation level;Previous level of function;Severity of impairments;Other (comment)   SLP Home Exercise Plan LSVT-LOUD daily homework   Consulted and Agree with Plan of Care  Patient        Problem List Patient Active Problem List   Diagnosis Date Noted  . Acquired hypothyroidism 12/23/2014  . Neuritis or radiculitis due to rupture of lumbar intervertebral disc 12/07/2014  . Lumbar canal stenosis 12/07/2014  . H/O surgical procedure 03/08/2014  . Carpal tunnel syndrome 12/03/2013  . Benign essential HTN 12/03/2013  . Degenerative joint disease involving multiple joints 12/03/2013  . Idiopathic Parkinson's disease (HCC) 12/03/2013   Dollene Primrose, MS/CCC- SLP  Leandrew Koyanagi 07/03/2015, 2:56 PM  [place] [facility] MAIN [facility] SERVICES [address] [place], [place], [address] Phone: [phone]   Fax:  [phone]

## 2015-07-04 ENCOUNTER — Ambulatory Visit: Payer: PPO | Admitting: Speech Pathology

## 2015-07-04 ENCOUNTER — Ambulatory Visit: Payer: PPO | Admitting: Physical Therapy

## 2015-07-04 ENCOUNTER — Encounter: Payer: Self-pay | Admitting: Physical Therapy

## 2015-07-04 DIAGNOSIS — R262 Difficulty in walking, not elsewhere classified: Secondary | ICD-10-CM

## 2015-07-04 DIAGNOSIS — R490 Dysphonia: Secondary | ICD-10-CM | POA: Diagnosis not present

## 2015-07-04 DIAGNOSIS — R531 Weakness: Secondary | ICD-10-CM

## 2015-07-04 NOTE — Therapy (Signed)
[facility] MAIN [facility] SERVICES [address] [place], [place], [address] Phone: [phone]   Fax:  [phone]  Physical Therapy Treatment  Patient Details  Name: Patricia [name] MRN: [id number] Date of Birth: 1933/12/10 Referring Provider:  Lonell Face, MD  Encounter Date: 07/04/2015      PT End of Session - 07/04/15 1501    Visit Number 10   Number of Visits 17   Date for PT Re-Evaluation 07/18/15   Authorization Type 10   PT Start Time 0300   Activity Tolerance Patient tolerated treatment well      Past Medical History  Diagnosis Date  . GERD (gastroesophageal reflux disease)   . Arthritis   . Hypertension   . Hypothyroidism   . Parkinson disease [facility])     Past Surgical History  Procedure Laterality Date  . Back surgery  2015  . Appendectomy  1959  . Thyroidectomy  1960  . Cholecystectomy  1998    There were no vitals filed for this visit.  Visit Diagnosis:  Difficulty walking  Weakness      Subjective Assessment - 07/03/15 1458    Subjective Patient is having some unsteady gait., but feels that she is improving.    Currently in Pain? No/denies      TEST Outcome Interpretation  5 times sit<>stand 25 sec >79 yo, >15 sec indicates increased risk for falls  10 meter walk test .98 m/s <1.0 m/s indicates increased risk for falls; limited community ambulator  Timed up and Go  12.86 sec <14 sec indicates increased risk for falls  6 minute walk test  925 Feet 1000 feet is community ambulator      [address] Peg Test L:26.29sec R: 26.50 sec                floor to ceiling x 10 reps, side to side 10 reps, step and reach forward x 10 reps, step and reach backwards x 10, step and reach sideways x 10 , Rock and reach forward/backward x 10 , Rock and reach sideways x 10, functional tasks; 1 sit to stand 2. Reaching with cones to duplicate putting  dishes away 3. hand coordination tasks 4. stepping up from foam to stool 5. washing hair and drying hair  Patient continues to demonstrates less incoordination of movement with select exercises such as rock and reach and stepping backwards Min cueing needed to appropriately perform LSVT tasks with leg, hand, and head position. Decreased coordination demonstrated requiring consistent verbal cueing to correct form. Cognitive understanding of task was delayed. Patient continues to demonstrate some in coordination of movement with select exercises such as rock and reach and stepping backwards. Patient responds well to verbal and tactile cues to correct form and technique. CGA to SBA for safety with activities. Uses to increase intensity and amplitude of movements throughout session                                    PT Education - 07/04/15 1500    Education provided Yes   Education Details LSVT BIG   Person(s) Educated Patient   Methods Explanation   Comprehension Verbalized understanding             PT Long Term Goals - 06/13/15 1517    PT LONG TERM GOAL #1   Title Patient (> 79 years old) will complete five times sit  to stand test in < 15 seconds indicating an increased LE strength and improved balance10/25/16   PT LONG TERM GOAL #2   Title Patient will increase six minute walk test distance to >1000 for progression to community ambulator and improve gait ability10/25/16   PT LONG TERM GOAL #3   Title Patient will increase 10 meter walk test to >1.103m/s as to improve gait speed for better community ambulation and to reduce fall risk. 07/18/15               Plan - 07/03/15 1500    Clinical Impression Statement  Patients performance improves with practice and she uses UE to help support and for balance.Pt able to perform large steps for ~5-10 seconds without cues before returning to more shuffled steps,pt continues to require modeling and tactile cuing  for head rotation with exercise #7. rock and reach sideways and for leg extension with exercise #2 side to side..   Pt will benefit from skilled therapeutic intervention in order to improve on the following deficits Abnormal gait;Difficulty walking;Decreased activity tolerance;Decreased balance;Decreased strength;Decreased mobility;Decreased coordination   Rehab Potential Good   PT Frequency 4x / week   PT Duration 4 weeks   PT Treatment/Interventions Balance training;Therapeutic exercise;Therapeutic activities;Functional mobility training;Stair training;Gait training;ADLs/Self Care Home Management   PT Next Visit Plan LSVT BIG   Consulted and Agree with Plan of Care Patient        Problem List Patient Active Problem List   Diagnosis Date Noted  . Acquired hypothyroidism 12/23/2014  . Neuritis or radiculitis due to rupture of lumbar intervertebral disc 12/07/2014  . Lumbar canal stenosis 12/07/2014  . H/O surgical procedure 03/08/2014  . Carpal tunnel syndrome 12/03/2013  . Benign essential HTN 12/03/2013  . Degenerative joint disease involving multiple joints 12/03/2013  . Idiopathic Parkinson's disease (HCC) 12/03/2013    Ezekiel Ina 07/04/2015, 3:03 PM  [place] [facility] MAIN [facility] SERVICES [address] [place], [place], [address] Phone: [phone]   Fax:  [phone]

## 2015-07-05 ENCOUNTER — Encounter: Payer: Self-pay | Admitting: Speech Pathology

## 2015-07-05 ENCOUNTER — Ambulatory Visit: Payer: PPO | Admitting: Speech Pathology

## 2015-07-05 ENCOUNTER — Ambulatory Visit: Payer: PPO | Admitting: Physical Therapy

## 2015-07-05 ENCOUNTER — Encounter: Payer: Self-pay | Admitting: Physical Therapy

## 2015-07-05 DIAGNOSIS — R490 Dysphonia: Secondary | ICD-10-CM

## 2015-07-05 DIAGNOSIS — R531 Weakness: Secondary | ICD-10-CM

## 2015-07-05 DIAGNOSIS — R262 Difficulty in walking, not elsewhere classified: Secondary | ICD-10-CM

## 2015-07-05 NOTE — Therapy (Signed)
[place] MAIN [facility] SERVICES [address] [place], [place], [address] Phone: [phone]   Fax:  [phone]  Speech Language Pathology Treatment  Patient Details  Name: Patricia [name] MRN: [id number] Date of Birth: Mar 15, 1934 Referring Provider:  Vladimir Crofts, MD  Encounter Date: 07/05/2015      End of Session - 07/05/15 1458    Visit Number 11   Number of Visits 17   Date for SLP Re-Evaluation 07/21/15   SLP Start Time 1400   SLP Stop Time  1455   SLP Time Calculation (min) 55 min   Activity Tolerance Patient tolerated treatment well      Past Medical History  Diagnosis Date  . GERD (gastroesophageal reflux disease)   . Arthritis   . Hypertension   . Hypothyroidism   . Parkinson disease [facility])     Past Surgical History  Procedure Laterality Date  . Back surgery  2015  . Appendectomy  1959  . Thyroidectomy  1960  . Cholecystectomy  1998    There were no vitals filed for this visit.  Visit Diagnosis: Dysphonia      Subjective Assessment - 07/05/15 1005    Subjective The patient reports that family are not asking her to speak up or repeat herself as often. She cannot really identify othe feedback.   Currently in Pain? No/denies               ADULT SLP TREATMENT - 07/05/15 1457    General Information   Behavior/Cognition Alert;Cooperative;Pleasant mood   Treatment Provided   Treatment provided Cognitive-Linquistic   Pain Assessment   Pain Assessment No/denies pain   Cognitive-Linquistic Treatment   Treatment focused on Voice   Skilled Treatment Daily Task #1: Average 9 seconds, 80 dB. Daily Task 2: Highs: 15 high pitched "ah" given min cues. Lows: 15 low pitched "ah" given min cues. Daily task #3: Average 75 dB.  Hierarchal speech loudness drill: Generate sentence given linguistic task, 72 dB.  Homework: assignments completed.  Off the cuff remarks: average 70 dB.   Assessment / Recommendations / Plan    Plan Continue with current plan of care   Progression Toward Goals   Progression toward goals Progressing toward goals          SLP Education - 07/05/15 1458    Education provided Yes   Education Details LSVT-LOUD   Person(s) Educated Patient   Methods Explanation;Demonstration;Verbal cues;Handout   Comprehension Verbalized understanding;Returned demonstration;Verbal cues required;Need further instruction            SLP Long Term Goals - 07/05/15 1010    SLP LONG TERM GOAL #1   Title The patient will complete Daily Tasks (Maximum duration "ah", High/Lows, and Functional Phrases) at average loudness of 80 dB and with loud, good quality voice.    Time 4   Period Weeks   Status Partially Met   SLP LONG TERM GOAL #2   Title The patient will complete Hierarchal Speech Loudness reading drills (words/phrases, sentences, and paragraph) at average 75 dB and with loud, good quality voice.     Time 4   Period Weeks   Status Partially Met   SLP LONG TERM GOAL #3   Title The patient will complete homework daily.   Time 4   Period Weeks   Status On-going   SLP LONG TERM GOAL #4   Title The patient will participate in conversation, maintaining average loudness of 75 dB and loud,  good quality voice.   Time 4   Period Weeks   Status Partially Met          Plan - 2015/07/26 1459    Clinical Impression Statement The patient is completing daily tasks and hierarchal speech drill tasks with loud, good quality voice given fewer SLP cues for loudness and quality.  She is demonstrating emerging generalization into conversational speech.   Speech Therapy Frequency 4x / week   Duration 4 weeks   Treatment/Interventions Other (comment)  LSVT-LOUD   Potential to Achieve Goals Good   Potential Considerations Ability to learn/carryover information;Cooperation/participation level;Previous level of function;Severity of impairments;Other (comment)   SLP Home Exercise Plan LSVT-LOUD daily homework    Consulted and Agree with Plan of Care Patient          G-Codes - 07-26-15 1008    Functional Assessment Tool Used LSVT-LOUD daily treatment   Functional Limitations Voice   Voice Current Status (G9171) At least 40 percent but less than 60 percent impaired, limited or restricted   Voice Goal Status (G9172) At least 1 percent but less than 20 percent impaired, limited or restricted      Problem List Patient Active Problem List   Diagnosis Date Noted  . Acquired hypothyroidism 12/23/2014  . Neuritis or radiculitis due to rupture of lumbar intervertebral disc 12/07/2014  . Lumbar canal stenosis 12/07/2014  . H/O surgical procedure 03/08/2014  . Carpal tunnel syndrome 12/03/2013  . Benign essential HTN 12/03/2013  . Degenerative joint disease involving multiple joints 12/03/2013  . Idiopathic Parkinson's disease ([place]) 12/03/2013   Leroy Sea, MS/CCC- SLP  [name] 07/26/15, 2:59 PM  [place] MAIN [facility] SERVICES [address] [place], [place], [address] Phone: [phone]   Fax:  [phone]

## 2015-07-05 NOTE — Therapy (Signed)
[facility] [address] [place], [place], [address] Phone: [phone]   Fax:  [phone]  Physical Therapy Treatment  Patient Details  Name: Patricia Patricia [name] Patricia Patricia [name] MRN: [id number] Date of Birth: 07/24/1934 Referring Provider:  Lonell FaceShah, Hemang K, MD  Encounter Date: 07/05/2015      PT End of Session - 07/05/15 1526    Visit Number [id number]   Authorization Type 11   PT Start Time 0300   PT Stop Time 0400   PT Time Calculation (min) 60 min   Activity Tolerance Patient tolerated treatment well      Past Medical History  Diagnosis Date  . GERD (gastroesophageal reflux disease)   . Arthritis   . Hypertension   . Hypothyroidism   . Parkinson disease [facility](HCC)     Past Surgical History  Procedure Laterality Date  . Back surgery  2015  . Appendectomy  1959  . Thyroidectomy  1960  . Cholecystectomy  1998    There were no vitals filed for this visit.  Visit Diagnosis:  Difficulty walking  Weakness      Subjective Assessment - 07/05/15 1517    Subjective Motor control of LE much improved.  Muscle fatigue but no major pain complaints      floor to ceiling x 10 reps, side to side 10 reps, step and reach forward x 10 reps, step and reach backwards x 10, step and reach sideways x 10 , Rock and reach forward/backward x 10 , Rock and reach sideways x 10, functional tasks; 1 sit to stand 2. Reaching with cones to duplicate putting dishes away 3. hand coordination tasks 4. stepping up from foam to stool 5. washing hair and drying hair  Patient continues to demonstrates less incoordination of movement with select exercises such as rock and reach and stepping backwards Min cueing needed to appropriately perform LSVT tasks with leg, hand, and head position. Decreased coordination demonstrated requiring consistent verbal cueing to correct form. Cognitive understanding of task was delayed. Patient continues to demonstrate some in  coordination of movement with select exercises such as rock and reach and stepping backwards. Patient responds well to verbal and tactile cues to correct form and technique. CGA to SBA for safety with activities. Uses to increase intensity and amplitude of movements throughout session                           PT Education - 07/05/15 1518    Education provided Yes   Education Details LSVT BIG   Person(Patricia [name]) Educated Patient   Methods Explanation   Comprehension Verbalized understanding             PT Long Term Goals - 06/13/15 1517    PT LONG TERM GOAL #1   Title Patient (> 79 years old) will complete five times sit to stand test in < 15 seconds indicating an increased LE strength and improved balance10/25/16   PT LONG TERM GOAL #2   Title Patient will increase six minute walk test distance to >1000 for progression to community ambulator and improve gait ability10/25/16   PT LONG TERM GOAL #3   Title Patient will increase 10 meter walk test to >1.5650m/Patricia [name] as to improve gait speed for better community ambulation and to reduce fall risk. 07/18/15               Plan - 07/05/15 1526    Clinical Impression Statement Continues to  have balance deficits typical with diagnosis. Patient performs intermediate level exercises without pain behaviors and needs verbal cuing for postural alignment and head positioning   Pt will benefit from skilled therapeutic intervention in order to improve on the following deficits Abnormal gait;Difficulty walking;Decreased activity tolerance;Decreased balance;Decreased strength;Decreased mobility;Decreased coordination   Rehab Potential Good   PT Frequency 4x / week   PT Duration 4 weeks   PT Treatment/Interventions Balance training;Therapeutic exercise;Therapeutic activities;Functional mobility training;Stair training;Gait training;ADLs/Self Care Home Management   PT Next Visit Plan LSVT BIG   Consulted and Agree with Plan of Care  Patient          G-Codes - Jul 25, 2015 1525    Functional Assessment Tool Used 5 x sit to stand, tug, 6 MW   Functional Limitation Mobility: Walking and moving around   Mobility: Walking and Moving Around Current Status (Z6109) At least 40 percent but less than 60 percent impaired, limited or restricted   Mobility: Walking and Moving Around Goal Status (U0454) At least 20 percent but less than 40 percent impaired, limited or restricted      Problem List Patient Active Problem List   Diagnosis Date Noted  . Acquired hypothyroidism 12/23/2014  . Neuritis or radiculitis due to rupture of lumbar intervertebral disc 12/07/2014  . Lumbar canal stenosis 12/07/2014  . H/O surgical procedure 03/08/2014  . Carpal tunnel syndrome 12/03/2013  . Benign essential HTN 12/03/2013  . Degenerative joint disease involving multiple joints 12/03/2013  . Idiopathic Parkinson'Patricia [name] disease (HCC) 12/03/2013    Ezekiel Ina 07/05/2015, 3:29 PM  [facility] MAIN [facility] SERVICES [address] [place], [place], [address] Phone: [phone]   Fax:  [phone]

## 2015-07-05 NOTE — Therapy (Signed)
[facility] MAIN REHAB SERVICES [address] [place], , [address] Phone: [phone]   Fax:  [phone]  Speech Language Pathology Treatment/Progress Note  Patient Details  Name: Patricia [name] MRN: [id number] Date of Birth: 07/18/1934 Referring Provider:  Shah, Hemang K, MD  Encounter Date: 07/04/2015      End of Session - 07/05/15 1006    Visit Number 10   Number of Visits 17   Date for SLP Re-Evaluation 07/21/15   SLP Start Time 1400   SLP Stop Time  1457   SLP Time Calculation (min) 57 min   Activity Tolerance Patient tolerated treatment well      Past Medical History  Diagnosis Date  . GERD (gastroesophageal reflux disease)   . Arthritis   . Hypertension   . Hypothyroidism   . Parkinson disease (HCC)     Past Surgical History  Procedure Laterality Date  . Back surgery  2015  . Appendectomy  1959  . Thyroidectomy  1960  . Cholecystectomy  1998    There were no vitals filed for this visit.  Visit Diagnosis: Dysphonia      Subjective Assessment - 07/05/15 1005    Subjective The patient reports that family are not asking her to speak up or repeat herself as often. She cannot really identify othe feedback.   Currently in Pain? No/denies               ADULT SLP TREATMENT - 07/05/15 0001    General Information   Behavior/Cognition Alert;Cooperative;Pleasant mood   Treatment Provided   Treatment provided Cognitive-Linquistic   Pain Assessment   Pain Assessment No/denies pain   Cognitive-Linquistic Treatment   Treatment focused on Voice   Skilled Treatment Daily Task #1: Average 7 seconds, 80 dB. Daily Task 2: Highs: 15 high pitched "ah" given min cues. Lows: 15 low pitched "ah" given min cues. Daily task #3: Average 75 dB.  Hierarchal speech loudness drill: Generate sentence given linguistic task, 72 dB.  Homework: assignments completed.  Off the cuff remarks: average 70 dB.   Assessment /  Recommendations / Plan   Plan Continue with current plan of care   Progression Toward Goals   Progression toward goals Progressing toward goals          SLP Education - 07/05/15 1006    Education provided Yes   Education Details LSVT-LOUD   Person(s) Educated Patient   Methods Explanation;Demonstration;Verbal cues;Handout   Comprehension Verbalized understanding;Returned demonstration;Verbal cues required;Need further instruction            SLP Long Term Goals - 07/05/15 1010    SLP LONG TERM GOAL #1   Title The patient will complete Daily Tasks (Maximum duration "ah", High/Lows, and Functional Phrases) at average loudness of 80 dB and with loud, good quality voice.    Time 4   Period Weeks   Status Partially Met   SLP LONG TERM GOAL #2   Title The patient will complete Hierarchal Speech Loudness reading drills (words/phrases, sentences, and paragraph) at average 75 dB and with loud, good quality voice.     Time 4   Period Weeks   Status Partially Met   SLP LONG TERM GOAL #3   Title The patient will complete homework daily.   Time 4   Period Weeks   Status On-going   SLP LONG TERM GOAL #4   Title The patient will participate in conversation, maintaining average loudness of 75 dB and   loud, good quality voice.   Time 4   Period Weeks   Status Partially Met          Plan - 2015-07-28 1007    Clinical Impression Statement The patient is completing daily tasks and hierarchal speech drill tasks with loud, good quality voice given fewer SLP cues for loudness and quality.  She is demonstrating emerging generalization into conversational speech.   Speech Therapy Frequency 4x / week   Duration 4 weeks   Treatment/Interventions Other (comment)  LSVT-LOUD   Potential to Achieve Goals Good   Potential Considerations Ability to learn/carryover information;Cooperation/participation level;Previous level of function;Severity of impairments;Other (comment)   SLP Home Exercise Plan  LSVT-LOUD daily homework   Consulted and Agree with Plan of Care Patient          G-Codes - Jul 28, 2015 1008    Functional Assessment Tool Used LSVT-LOUD daily treatment   Functional Limitations Voice   Voice Current Status (G9171) At least 40 percent but less than 60 percent impaired, limited or restricted   Voice Goal Status (G9172) At least 1 percent but less than 20 percent impaired, limited or restricted      Problem List Patient Active Problem List   Diagnosis Date Noted  . Acquired hypothyroidism 12/23/2014  . Neuritis or radiculitis due to rupture of lumbar intervertebral disc 12/07/2014  . Lumbar canal stenosis 12/07/2014  . H/O surgical procedure 03/08/2014  . Carpal tunnel syndrome 12/03/2013  . Benign essential HTN 12/03/2013  . Degenerative joint disease involving multiple joints 12/03/2013  . Idiopathic Parkinson's disease ([place]) 12/03/2013   Leroy Sea, MS/CCC- SLP  [name] 28-Jul-2015, 10:13 AM  [place] MAIN [facility] SERVICES [address] [place], [place], [address] Phone: [phone]   Fax:  [phone]

## 2015-07-06 ENCOUNTER — Ambulatory Visit: Payer: PPO | Admitting: Speech Pathology

## 2015-07-06 ENCOUNTER — Ambulatory Visit: Payer: PPO | Admitting: Physical Therapy

## 2015-07-06 ENCOUNTER — Encounter: Payer: Self-pay | Admitting: Physical Therapy

## 2015-07-06 DIAGNOSIS — R490 Dysphonia: Secondary | ICD-10-CM | POA: Diagnosis not present

## 2015-07-06 DIAGNOSIS — R531 Weakness: Secondary | ICD-10-CM

## 2015-07-06 DIAGNOSIS — R262 Difficulty in walking, not elsewhere classified: Secondary | ICD-10-CM

## 2015-07-06 NOTE — Therapy (Signed)
[place] [facility] MAIN [facility] SERVICES [address] [place], [place], [address] Phone: [phone]   Fax:  [phone]  Physical Therapy Treatment  Patient Details  Name: Patricia [name] MRN: [id number] Date of Birth: 18-Apr-1934 Referring Provider:  Lonell Face, MD  Encounter Date: 07/06/2015      PT End of Session - 07/06/15 1516    Visit Number 12   Authorization Type 12   PT Start Time 0300   PT Stop Time 0400   PT Time Calculation (min) 60 min   Activity Tolerance Patient tolerated treatment well      Past Medical History  Diagnosis Date  . GERD (gastroesophageal reflux disease)   . Arthritis   . Hypertension   . Hypothyroidism   . Parkinson disease [facility])     Past Surgical History  Procedure Laterality Date  . Back surgery  2015  . Appendectomy  1959  . Thyroidectomy  1960  . Cholecystectomy  1998    There were no vitals filed for this visit.  Visit Diagnosis:  Difficulty walking  Weakness      Subjective Assessment - 07/06/15 1515    Subjective Cuing is needed to stretch the leg out in seated side reaching   Currently in Pain? No/denies         floor to ceiling x 10 reps, side to side 10 reps, step and reach forward x 10 reps, step and reach backwards x 10, step and reach sideways x 10 , Rock and reach forward/backward x 10 , Rock and reach sideways x 10, functional tasks; 1 sit to stand 2. Reaching with cones to duplicate putting dishes away 3. hand coordination tasks 4. stepping up from foam to stool 5. washing hair and drying hair  Patient continues to demonstrates less incoordination of movement with select exercises such as rock and reach and stepping backwards Min cueing needed to appropriately perform LSVT tasks with leg, hand, and head position. Decreased coordination demonstrated requiring consistent verbal cueing to correct form. Cognitive understanding of task was delayed. Patient continues to demonstrate  some in coordination of movement with select exercises such as rock and reach and stepping backwards. Patient responds well to verbal and tactile cues to correct form and technique. CGA to SBA for safety with activities. Uses to increase intensity and amplitude of movements throughout session                         PT Education - 07/06/15 1516    Education provided Yes   Education Details LSVT BIG   Person(s) Educated Patient   Methods Explanation   Comprehension Verbalized understanding             PT Long Term Goals - 06/13/15 1517    PT LONG TERM GOAL #1   Title Patient (> 98 years old) will complete five times sit to stand test in < 15 seconds indicating an increased LE strength and improved balance10/25/16   PT LONG TERM GOAL #2   Title Patient will increase six minute walk test distance to >1000 for progression to community ambulator and improve gait ability10/25/16   PT LONG TERM GOAL #3   Title Patient will increase 10 meter walk test to >1.58m/s as to improve gait speed for better community ambulation and to reduce fall risk. 07/18/15               Plan - 07/06/15 1517  Clinical Impression Statement  Decreased coordination demonstrated requiring consistent verbal cueing to correct form   Pt will benefit from skilled therapeutic intervention in order to improve on the following deficits Abnormal gait;Difficulty walking;Decreased activity tolerance;Decreased balance;Decreased strength;Decreased mobility;Decreased coordination   Rehab Potential Good   PT Frequency 4x / week   PT Duration 4 weeks   PT Treatment/Interventions Balance training;Therapeutic exercise;Therapeutic activities;Functional mobility training;Stair training;Gait training;ADLs/Self Care Home Management   PT Next Visit Plan LSVT BIG   Consulted and Agree with Plan of Care Patient        Problem List Patient Active Problem List   Diagnosis Date Noted  . Acquired  hypothyroidism 12/23/2014  . Neuritis or radiculitis due to rupture of lumbar intervertebral disc 12/07/2014  . Lumbar canal stenosis 12/07/2014  . H/O surgical procedure 03/08/2014  . Carpal tunnel syndrome 12/03/2013  . Benign essential HTN 12/03/2013  . Degenerative joint disease involving multiple joints 12/03/2013  . Idiopathic Parkinson's disease (HCC) 12/03/2013    Ezekiel InaMansfield, Shykeria Sakamoto S 07/06/2015, 3:18 PM  [facility] [address] , [place], [address] Phone: [phone]   Fax:  [phone]

## 2015-07-07 ENCOUNTER — Encounter: Payer: Self-pay | Admitting: Speech Pathology

## 2015-07-07 NOTE — Therapy (Signed)
[place] MAIN [facility] SERVICES [address] [place], [place], [address] Phone: [phone]   Fax:  [phone]  Speech Language Pathology Treatment  Patient Details  Name: Patricia [name] MRN: [id number] Date of Birth: Aug 24, 1934 Referring Provider: Jennings Books  Encounter Date: 07/06/2015      End of Session - 07/07/15 1003    Visit Number 12   Number of Visits 17   Date for SLP Re-Evaluation 07/21/15   SLP Start Time 1400   SLP Stop Time  1453   SLP Time Calculation (min) 53 min   Activity Tolerance Patient tolerated treatment well      Past Medical History  Diagnosis Date  . GERD (gastroesophageal reflux disease)   . Arthritis   . Hypertension   . Hypothyroidism   . Parkinson disease [facility])     Past Surgical History  Procedure Laterality Date  . Back surgery  2015  . Appendectomy  1959  . Thyroidectomy  1960  . Cholecystectomy  1998    There were no vitals filed for this visit.  Visit Diagnosis: Dysphonia      Subjective Assessment - 07/07/15 1002    Subjective The patient reports that family are not asking her to speak up or repeat herself as often.  She states that she will really try to use her LOUD non-raspy voice more often.   Currently in Pain? No/denies           SLP Evaluation [facility] - 07/07/15 0001    SLP Visit Information   Referring Provider Jennings Books            ADULT SLP TREATMENT - 07/07/15 0001    General Information   Behavior/Cognition Alert;Cooperative;Pleasant mood   Treatment Provided   Treatment provided Cognitive-Linquistic   Pain Assessment   Pain Assessment No/denies pain   Cognitive-Linquistic Treatment   Treatment focused on Voice   Skilled Treatment Daily Task #1: Average 9 seconds, 80 dB. Daily Task 2: Highs: 15 high pitched "ah" given min cues. Lows: 15 low pitched "ah" given min cues. Daily task #3: Average 75 dB.  Hierarchal speech loudness drill: Read sentences, 74  dB.  Generate sentence given linguistic task, 72 dB.  Homework: assignments completed.  Off the cuff remarks: average 70 dB.   Assessment / Recommendations / Plan   Plan Continue with current plan of care   Progression Toward Goals   Progression toward goals Progressing toward goals          SLP Education - 07/07/15 1003    Education provided Yes   Education Details LSVT-LOUD   Person(s) Educated Patient   Methods Explanation;Demonstration;Verbal cues;Handout   Comprehension Verbalized understanding;Returned demonstration;Verbal cues required;Need further instruction            SLP Long Term Goals - 07/05/15 1010    SLP LONG TERM GOAL #1   Title The patient will complete Daily Tasks (Maximum duration "ah", High/Lows, and Functional Phrases) at average loudness of 80 dB and with loud, good quality voice.    Time 4   Period Weeks   Status Partially Met   SLP LONG TERM GOAL #2   Title The patient will complete Hierarchal Speech Loudness reading drills (words/phrases, sentences, and paragraph) at average 75 dB and with loud, good quality voice.     Time 4   Period Weeks   Status Partially Met   SLP LONG TERM GOAL #3   Title The patient will complete homework  daily.   Time 4   Period Weeks   Status On-going   SLP LONG TERM GOAL #4   Title The patient will participate in conversation, maintaining average loudness of 75 dB and loud, good quality voice.   Time 4   Period Weeks   Status Partially Met          Plan - 07/07/15 1003    Clinical Impression Statement The patient is completing daily tasks and hierarchal speech drill tasks with loud, good quality voice given fewer SLP cues for loudness and quality.  She is demonstrating emerging generalization into conversational speech.   Speech Therapy Frequency 4x / week   Duration 4 weeks   Treatment/Interventions Other (comment)  LSVT-LOUD   Potential to Achieve Goals Good   Potential Considerations Ability to  learn/carryover information;Cooperation/participation level;Previous level of function;Severity of impairments;Other (comment)   SLP Home Exercise Plan LSVT-LOUD daily homework   Consulted and Agree with Plan of Care Patient        Problem List Patient Active Problem List   Diagnosis Date Noted  . Acquired hypothyroidism 12/23/2014  . Neuritis or radiculitis due to rupture of lumbar intervertebral disc 12/07/2014  . Lumbar canal stenosis 12/07/2014  . H/O surgical procedure 03/08/2014  . Carpal tunnel syndrome 12/03/2013  . Benign essential HTN 12/03/2013  . Degenerative joint disease involving multiple joints 12/03/2013  . Idiopathic Parkinson's disease ([place]) 12/03/2013   Leroy Sea, MS/CCC- SLP  [name] 07/07/2015, 10:04 AM  [place] MAIN [facility] SERVICES [address] [place], [place], [address] Phone: [phone]   Fax:  [phone]   Name: YARELLY KUBA MRN: [id number] Date of Birth: 1934-06-04

## 2015-07-10 ENCOUNTER — Encounter: Payer: Self-pay | Admitting: Physical Therapy

## 2015-07-10 ENCOUNTER — Ambulatory Visit: Payer: PPO | Admitting: Speech Pathology

## 2015-07-10 ENCOUNTER — Encounter: Payer: Self-pay | Admitting: Speech Pathology

## 2015-07-10 ENCOUNTER — Ambulatory Visit: Payer: PPO | Admitting: Physical Therapy

## 2015-07-10 ENCOUNTER — Encounter: Payer: PPO | Admitting: Speech Pathology

## 2015-07-10 DIAGNOSIS — R490 Dysphonia: Secondary | ICD-10-CM

## 2015-07-10 DIAGNOSIS — R531 Weakness: Secondary | ICD-10-CM

## 2015-07-10 DIAGNOSIS — R262 Difficulty in walking, not elsewhere classified: Secondary | ICD-10-CM

## 2015-07-10 NOTE — Therapy (Signed)
[place] MAIN [facility] SERVICES [address] [place], [place], [address] Phone: [phone]   Fax:  [phone]  Speech Language Pathology Treatment  Patient Details  Name: NANAKO STOPHER MRN: [id number] Date of Birth: 21-Jun-1934 Referring Provider: Jennings Books  Encounter Date: 07/10/2015      End of Session - 07/10/15 1537    Visit Number 13   Number of Visits 17   Date for SLP Re-Evaluation 07/21/15   SLP Start Time 1400   SLP Stop Time  1455   SLP Time Calculation (min) 55 min   Activity Tolerance Patient tolerated treatment well      Past Medical History  Diagnosis Date  . GERD (gastroesophageal reflux disease)   . Arthritis   . Hypertension   . Hypothyroidism   . Parkinson disease [facility])     Past Surgical History  Procedure Laterality Date  . Back surgery  2015  . Appendectomy  1959  . Thyroidectomy  1960  . Cholecystectomy  1998    There were no vitals filed for this visit.  Visit Diagnosis: Dysphonia      Subjective Assessment - 07/10/15 1537    Subjective The patient reports that she was successful in speaking clearly when talking to a business on the phone.   Currently in Pain? No/denies               ADULT SLP TREATMENT - 07/10/15 0001    General Information   Behavior/Cognition Alert;Cooperative;Pleasant mood   Treatment Provided   Treatment provided Cognitive-Linquistic   Pain Assessment   Pain Assessment No/denies pain   Cognitive-Linquistic Treatment   Treatment focused on Voice   Skilled Treatment Daily Task #1: Average 7 seconds, 80 dB. Daily Task 2: Highs: 15 high pitched "ah" given min-mod cues. Lows: 15 low pitched "ah" given min cues. Daily task #3: Average 73 dB.  Hierarchal speech loudness drill: Read sentences, 70 dB.  Generate sentence given linguistic task, 72 dB.  Homework: assignments completed.  Off the cuff remarks: average 67 dB.   Assessment / Recommendations / Plan   Plan  Continue with current plan of care   Progression Toward Goals   Progression toward goals Progressing toward goals          SLP Education - 07/10/15 1537    Education provided Yes   Education Details LSVT-LOUD   Person(s) Educated Patient   Methods Explanation;Demonstration;Verbal cues;Handout   Comprehension Verbalized understanding;Returned demonstration;Verbal cues required;Need further instruction            SLP Long Term Goals - 07/05/15 1010    SLP LONG TERM GOAL #1   Title The patient will complete Daily Tasks (Maximum duration "ah", High/Lows, and Functional Phrases) at average loudness of 80 dB and with loud, good quality voice.    Time 4   Period Weeks   Status Partially Met   SLP LONG TERM GOAL #2   Title The patient will complete Hierarchal Speech Loudness reading drills (words/phrases, sentences, and paragraph) at average 75 dB and with loud, good quality voice.     Time 4   Period Weeks   Status Partially Met   SLP LONG TERM GOAL #3   Title The patient will complete homework daily.   Time 4   Period Weeks   Status On-going   SLP LONG TERM GOAL #4   Title The patient will participate in conversation, maintaining average loudness of 75 dB and loud, good quality voice.  Time 4   Period Weeks   Status Partially Met          Plan - 07/10/15 1538    Clinical Impression Statement The patient is completing daily tasks and hierarchal speech drill tasks with loud, good quality voice given SLP cues for loudness and quality.  She is demonstrating emerging generalization into conversational speech.   Speech Therapy Frequency 4x / week   Duration 4 weeks   Treatment/Interventions Other (comment)  LSVT-LOUD   Potential to Achieve Goals Good   Potential Considerations Ability to learn/carryover information;Cooperation/participation level;Previous level of function;Severity of impairments;Other (comment)   SLP Home Exercise Plan LSVT-LOUD daily homework    Consulted and Agree with Plan of Care Patient        Problem List Patient Active Problem List   Diagnosis Date Noted  . Acquired hypothyroidism 12/23/2014  . Neuritis or radiculitis due to rupture of lumbar intervertebral disc 12/07/2014  . Lumbar canal stenosis 12/07/2014  . H/O surgical procedure 03/08/2014  . Carpal tunnel syndrome 12/03/2013  . Benign essential HTN 12/03/2013  . Degenerative joint disease involving multiple joints 12/03/2013  . Idiopathic Parkinson's disease ([place]) 12/03/2013   Leroy Sea, MS/CCC- SLP  [name] 07/10/2015, 3:39 PM  [place] MAIN [facility] SERVICES [address] [place], [place], [address] Phone: [phone]   Fax:  [phone]   Name: CHYANNA FLOCK MRN: [id number] Date of Birth: [birth year]

## 2015-07-10 NOTE — Therapy (Signed)
[place] [facility] [address] [place], [place], [address] Phone: [phone]   Fax:  [phone]  Physical Therapy Treatment  Patient Details  Name: Patricia [name] MRN: [id number] Date of Birth: 02/24/1934 No Data Recorded  Encounter Date: 07/10/2015      PT End of Session - 07/10/15 1514    Visit Number 13   Date for PT Re-Evaluation 07/18/15   PT Start Time 0300   PT Stop Time 0400   PT Time Calculation (min) 60 min   Activity Tolerance Patient tolerated treatment well      Past Medical History  Diagnosis Date  . GERD (gastroesophageal reflux disease)   . Arthritis   . Hypertension   . Hypothyroidism   . Parkinson disease [facility](HCC)     Past Surgical History  Procedure Laterality Date  . Back surgery  2015  . Appendectomy  1959  . Thyroidectomy  1960  . Cholecystectomy  1998    There were no vitals filed for this visit.  Visit Diagnosis:  Difficulty walking  Weakness      Subjective Assessment - 07/10/15 1513    Subjective Fatigue with sit to stand but demonstrating more control    Currently in Pain? No/denies         floor to ceiling x 10 reps, side to side 10 reps, step and reach forward x 10 reps, step and reach backwards x 10, step and reach sideways x 10 , Rock and reach forward/backward x 10 , Rock and reach sideways x 10, functional tasks; 1 sit to stand 2. Reaching with cones to duplicate putting dishes away 3. hand coordination tasks 4. stepping up from foam to stool 5. washing hair and drying hair  Patient continues to demonstrates less incoordination of movement with select exercises such as rock and reach and stepping backwards Min cueing needed to appropriately perform LSVT tasks with leg, hand, and head position. Decreased coordination demonstrated requiring consistent verbal cueing to correct form. Cognitive understanding of task was delayed. Patient continues to demonstrate some in  coordination of movement with select exercises such as rock and reach and stepping backwards. Patient responds well to verbal and tactile cues to correct form and technique. CGA to SBA for safety with activities. Uses to increase intensity and amplitude of movements throughout session                         PT Education - 07/10/15 1513    Education provided Yes   Education Details LSVT BIG   Person(s) Educated Patient   Methods Explanation   Comprehension Verbalized understanding             PT Long Term Goals - 06/13/15 1517    PT LONG TERM GOAL #1   Title Patient (> 79 years old) will complete five times sit to stand test in < 15 seconds indicating an increased LE strength and improved balance10/25/16   PT LONG TERM GOAL #2   Title Patient will increase six minute walk test distance to >1000 for progression to community ambulator and improve gait ability10/25/16   PT LONG TERM GOAL #3   Title Patient will increase 10 meter walk test to >1.7666m/s as to improve gait speed for better community ambulation and to reduce fall risk. 07/18/15               Plan - 07/10/15 1514    Clinical Impression Statement Fatigue  with sit to stand but demonstrating more control    Rehab Potential Good   PT Frequency 4x / week   PT Duration 4 weeks   PT Treatment/Interventions Balance training;Therapeutic exercise;Therapeutic activities;Functional mobility training;Stair training;Gait training;ADLs/Self Care Home Management        Problem List Patient Active Problem List   Diagnosis Date Noted  . Acquired hypothyroidism 12/23/2014  . Neuritis or radiculitis due to rupture of lumbar intervertebral disc 12/07/2014  . Lumbar canal stenosis 12/07/2014  . H/O surgical procedure 03/08/2014  . Carpal tunnel syndrome 12/03/2013  . Benign essential HTN 12/03/2013  . Degenerative joint disease involving multiple joints 12/03/2013  . Idiopathic Parkinson's disease (HCC)  12/03/2013    Ezekiel Ina 07/10/2015, 3:16 PM  [place] [facility] MAIN [facility] SERVICES [address] [place], [place], [address] Phone: [phone]   Fax:  [phone]  Name: Patricia [name] MRN: [id number] Date of Birth: [birth year]

## 2015-07-11 ENCOUNTER — Encounter: Payer: Self-pay | Admitting: Physical Therapy

## 2015-07-11 ENCOUNTER — Ambulatory Visit: Payer: PPO | Admitting: Physical Therapy

## 2015-07-11 ENCOUNTER — Ambulatory Visit: Payer: PPO | Admitting: Speech Pathology

## 2015-07-11 DIAGNOSIS — R490 Dysphonia: Secondary | ICD-10-CM | POA: Diagnosis not present

## 2015-07-11 DIAGNOSIS — R531 Weakness: Secondary | ICD-10-CM

## 2015-07-11 DIAGNOSIS — R262 Difficulty in walking, not elsewhere classified: Secondary | ICD-10-CM

## 2015-07-11 NOTE — Therapy (Signed)
[place] [facility] MAIN [facility] SERVICES [address] [place], [place], [address] Phone: [phone]   Fax:  [phone]  Physical Therapy Treatment  Patient Details  Name: Patricia [name] MRN: [id number] Date of Birth: [birth year] No Data Recorded  Encounter Date: 07/11/2015      PT End of Session - 07/11/15 1512    Visit Number 14   Number of Visits 17   Authorization Type 14   PT Start Time 0300   PT Stop Time 0355   PT Time Calculation (min) 55 min   Activity Tolerance Patient tolerated treatment well   Behavior During Therapy [facility] for tasks assessed/performed      Past Medical History  Diagnosis Date  . GERD (gastroesophageal reflux disease)   . Arthritis   . Hypertension   . Hypothyroidism   . Parkinson disease [facility])     Past Surgical History  Procedure Laterality Date  . Back surgery  2015  . Appendectomy  1959  . Thyroidectomy  1960  . Cholecystectomy  1998    There were no vitals filed for this visit.  Visit Diagnosis:  Difficulty walking  Weakness      Subjective Assessment - 07/11/15 1511    Subjective Patient is doing well today and is performing her HEP.   Currently in Pain? No/denies         floor to ceiling x 10 reps, side to side 10 reps, step and reach forward x 10 reps, step and reach backwards x 10, step and reach sideways x 10 , Rock and reach forward/backward x 10 , Rock and reach sideways x 10, functional tasks; 1 sit to stand 2. Reaching with cones to duplicate putting dishes away 3. hand coordination tasks 4. stepping up from foam to stool 5. washing hair and drying hair  Patient continues to demonstrates less incoordination of movement with select exercises such as rock and reach and stepping backwards Min cueing needed to appropriately perform LSVT tasks with leg, hand, and head position. Decreased coordination demonstrated requiring consistent verbal cueing to correct form. Cognitive understanding  of task was delayed. Patient continues to demonstrate some in coordination of movement with select exercises such as rock and reach and stepping backwards. Patient responds well to verbal and tactile cues to correct form and technique. CGA to SBA for safety with activities. Uses to increase intensity and amplitude of movements throughout session                         PT Education - 07/11/15 1511    Education provided Yes   Education Details LSVT BIG   Person(s) Educated Patient   Methods Explanation   Comprehension Verbalized understanding             PT Long Term Goals - 06/13/15 1517    PT LONG TERM GOAL #1   Title Patient (> 106 years old) will complete five times sit to stand test in < 15 seconds indicating an increased LE strength and improved balance10/25/16   PT LONG TERM GOAL #2   Title Patient will increase six minute walk test distance to >1000 for progression to community ambulator and improve gait ability10/25/16   PT LONG TERM GOAL #3   Title Patient will increase 10 meter walk test to >1.43m/s as to improve gait speed for better community ambulation and to reduce fall risk. 07/18/15  Plan - 07/11/15 1512    Clinical Impression Statement Continues to have balance deficits typical with diagnosis. Patient performs intermediate level exercises without pain behaviors and needs verbal cuing for postural alignment and head positioning   Rehab Potential Good   PT Frequency 4x / week   PT Duration 4 weeks   PT Treatment/Interventions Balance training;Therapeutic exercise;Therapeutic activities;Functional mobility training;Stair training;Gait training;ADLs/Self Care Home Management        Problem List Patient Active Problem List   Diagnosis Date Noted  . Acquired hypothyroidism 12/23/2014  . Neuritis or radiculitis due to rupture of lumbar intervertebral disc 12/07/2014  . Lumbar canal stenosis 12/07/2014  . H/O surgical  procedure 03/08/2014  . Carpal tunnel syndrome 12/03/2013  . Benign essential HTN 12/03/2013  . Degenerative joint disease involving multiple joints 12/03/2013  . Idiopathic Parkinson's disease (HCC) 12/03/2013    Ezekiel InaMansfield, Kristine S 07/11/2015, 3:14 PM  [place] [facility] [address] [place], [place], [address] Phone: [phone]   Fax:  [phone]  Name: Patricia [name] MRN: [id number] Date of Birth: 06/06/1934

## 2015-07-12 ENCOUNTER — Encounter: Payer: Self-pay | Admitting: Speech Pathology

## 2015-07-12 ENCOUNTER — Ambulatory Visit: Payer: PPO | Admitting: Physical Therapy

## 2015-07-12 ENCOUNTER — Encounter: Payer: Self-pay | Admitting: Physical Therapy

## 2015-07-12 ENCOUNTER — Ambulatory Visit: Payer: PPO | Admitting: Speech Pathology

## 2015-07-12 DIAGNOSIS — R531 Weakness: Secondary | ICD-10-CM

## 2015-07-12 DIAGNOSIS — R490 Dysphonia: Secondary | ICD-10-CM | POA: Diagnosis not present

## 2015-07-12 DIAGNOSIS — R262 Difficulty in walking, not elsewhere classified: Secondary | ICD-10-CM

## 2015-07-12 NOTE — Therapy (Signed)
[place] MAIN [facility] SERVICES [address] [place], [place], [address] Phone: [phone]   Fax:  [phone]  Speech Language Pathology Treatment  Patient Details  Name: Patricia [name] MRN: [id number] Date of Birth: 07-08-1934 Referring Provider: Jennings Books  Encounter Date: 07/11/2015      End of Session - 07/12/15 0913    Visit Number 14   Number of Visits 17   Date for SLP Re-Evaluation 07/21/15   SLP Start Time 0900   SLP Stop Time  0938   SLP Time Calculation (min) 357 min   Activity Tolerance Patient tolerated treatment well      Past Medical History  Diagnosis Date  . GERD (gastroesophageal reflux disease)   . Arthritis   . Hypertension   . Hypothyroidism   . Parkinson disease [facility])     Past Surgical History  Procedure Laterality Date  . Back surgery  2015  . Appendectomy  1959  . Thyroidectomy  1960  . Cholecystectomy  1998    There were no vitals filed for this visit.  Visit Diagnosis: Dysphonia      Subjective Assessment - 07/12/15 0912    Subjective The patient reports that she was successful in speaking clearly when talking to a business on the phone.  She was noticeably louder when telling SLP about the experience.   Currently in Pain? No/denies               ADULT SLP TREATMENT - 07/12/15 0001    General Information   Behavior/Cognition Alert;Cooperative;Pleasant mood   Treatment Provided   Treatment provided Cognitive-Linquistic   Pain Assessment   Pain Assessment No/denies pain   Cognitive-Linquistic Treatment   Treatment focused on Voice   Skilled Treatment Daily Task #1: Average 7 seconds, 80 dB. Daily Task 2: Highs: 15 high pitched "ah" given min-mod cues. Lows: 15 low pitched "ah" given min cues. Daily task #3: Average 73 dB.  Hierarchal speech loudness drill: Read sentences, 70 dB.  Generate sentence given linguistic task, 72 dB.  Homework: assignments completed.  Off the cuff  remarks: average 67 dB.   Assessment / Recommendations / Plan   Plan Continue with current plan of care   Progression Toward Goals   Progression toward goals Progressing toward goals          SLP Education - 07/12/15 0913    Education provided Yes   Education Details LSVT-LOUD   Person(s) Educated Patient   Methods Explanation;Demonstration;Verbal cues;Handout   Comprehension Verbalized understanding;Returned demonstration;Verbal cues required;Need further instruction            SLP Long Term Goals - 07/05/15 1010    SLP LONG TERM GOAL #1   Title The patient will complete Daily Tasks (Maximum duration "ah", High/Lows, and Functional Phrases) at average loudness of 80 dB and with loud, good quality voice.    Time 4   Period Weeks   Status Partially Met   SLP LONG TERM GOAL #2   Title The patient will complete Hierarchal Speech Loudness reading drills (words/phrases, sentences, and paragraph) at average 75 dB and with loud, good quality voice.     Time 4   Period Weeks   Status Partially Met   SLP LONG TERM GOAL #3   Title The patient will complete homework daily.   Time 4   Period Weeks   Status On-going   SLP LONG TERM GOAL #4   Title The patient will participate in conversation,  maintaining average loudness of 75 dB and loud, good quality voice.   Time 4   Period Weeks   Status Partially Met          Plan - 07/12/15 0913    Clinical Impression Statement The patient is completing daily tasks and hierarchal speech drill tasks with loud, good quality voice given SLP cues for loudness and quality.  She is demonstrating emerging generalization into conversational speech.   Speech Therapy Frequency 4x / week   Duration 4 weeks   Treatment/Interventions Other (comment)  LSVT-LOUD   Potential to Achieve Goals Good   Potential Considerations Ability to learn/carryover information;Cooperation/participation level;Previous level of function;Severity of impairments;Other  (comment)   SLP Home Exercise Plan LSVT-LOUD daily homework   Consulted and Agree with Plan of Care Patient        Problem List Patient Active Problem List   Diagnosis Date Noted  . Acquired hypothyroidism 12/23/2014  . Neuritis or radiculitis due to rupture of lumbar intervertebral disc 12/07/2014  . Lumbar canal stenosis 12/07/2014  . H/O surgical procedure 03/08/2014  . Carpal tunnel syndrome 12/03/2013  . Benign essential HTN 12/03/2013  . Degenerative joint disease involving multiple joints 12/03/2013  . Idiopathic Parkinson's disease ([name]) 12/03/2013   Patricia Sea, MS/CCC- SLP  Patricia [name] 07/12/2015, 9:14 AM  [place] MAIN [facility] SERVICES [address] [place], [place], [address] Phone: [phone]   Fax:  [phone]   Name: Patricia [name] MRN: [id number] Date of Birth: 08-26-1934

## 2015-07-12 NOTE — Therapy (Signed)
[place] [facility] MAIN [facility] SERVICES [address] [place], [place], [address] Phone: [phone]   Fax:  [phone]  Physical Therapy Treatment  Patient Details  Name: Patricia [name] MRN: [id number] Date of Birth: 28-Sep-1933 No Data Recorded  Encounter Date: 07/12/2015      PT End of Session - 07/12/15 1503    Visit Number 15   Number of Visits 17   Date for PT Re-Evaluation 07/18/15   Authorization Type 15   PT Start Time 0300   PT Stop Time 0355   PT Time Calculation (min) 55 min   Activity Tolerance Patient tolerated treatment well   Behavior During Therapy [facility] for tasks assessed/performed      Past Medical History  Diagnosis Date  . GERD (gastroesophageal reflux disease)   . Arthritis   . Hypertension   . Hypothyroidism   . Parkinson disease [facility])     Past Surgical History  Procedure Laterality Date  . Back surgery  2015  . Appendectomy  1959  . Thyroidectomy  1960  . Cholecystectomy  1998    There were no vitals filed for this visit.  Visit Diagnosis:  Difficulty walking  Weakness      Subjective Assessment - 07/11/15 1511    Subjective Patient is doing well today and is performing her HEP.   Currently in Pain? No/denies          floor to ceiling x 10 reps, side to side 10 reps, step and reach forward x 10 reps, step and reach backwards x 10, step and reach sideways x 10 , Rock and reach forward/backward x 10 , Rock and reach sideways x 10, functional tasks; 1 sit to stand 2. Reaching with cones to duplicate putting dishes away 3. hand coordination tasks 4. stepping up from foam to stool 5. washing hair and drying hair  Patient continues to demonstrates less incoordination of movement with select exercises such as rock and reach and stepping backwards Min cueing needed to appropriately perform LSVT tasks with leg, hand, and head position. Decreased coordination demonstrated requiring consistent verbal cueing to  correct form. Cognitive understanding of task was delayed. Patient continues to demonstrate some in coordination of movement with select exercises such as rock and reach and stepping backwards. Patient responds well to verbal and tactile cues to correct form and technique. CGA to SBA for safety with activities. Uses to increase intensity and amplitude of movements throughout session                                  PT Education - 07/11/15 1511    Education provided Yes   Education Details LSVT BIG   Person(s) Educated Patient   Methods Explanation   Comprehension Verbalized understanding             PT Long Term Goals - 06/13/15 1517    PT LONG TERM GOAL #1   Title Patient (> 48 years old) will complete five times sit to stand test in < 15 seconds indicating an increased LE strength and improved balance10/25/16   PT LONG TERM GOAL #2   Title Patient will increase six minute walk test distance to >1000 for progression to community ambulator and improve gait ability10/25/16   PT LONG TERM GOAL #3   Title Patient will increase 10 meter walk test to >1.57m/s as to improve gait speed for better community ambulation and  to reduce fall risk. 07/18/15               Plan - 07/12/15 1504    Clinical Impression Statement PT provided min - moderate verbal instruction to improve set up, proper use of UE, and improved posture and gait mechanics. Patient responded moderately to instruction   PT Frequency 4x / week   PT Duration 4 weeks   PT Treatment/Interventions Balance training;Therapeutic exercise;Therapeutic activities;Functional mobility training;Stair training;Gait training;ADLs/Self Care Home Management   PT Next Visit Plan LSVT BIG        Problem List Patient Active Problem List   Diagnosis Date Noted  . Acquired hypothyroidism 12/23/2014  . Neuritis or radiculitis due to rupture of lumbar intervertebral disc 12/07/2014  . Lumbar canal stenosis  12/07/2014  . H/O surgical procedure 03/08/2014  . Carpal tunnel syndrome 12/03/2013  . Benign essential HTN 12/03/2013  . Degenerative joint disease involving multiple joints 12/03/2013  . Idiopathic Parkinson's disease (HCC) 12/03/2013    Ezekiel InaMansfield, Kyrillos Adams S 07/12/2015, 3:20 PM  [place] [facility] [address] [place], [place], [address] Phone: [phone]   Fax:  [phone]  Name: Patricia [name] MRN: [id number] Date of Birth: 05/05/1934

## 2015-07-12 NOTE — Therapy (Signed)
[place] MAIN [facility] SERVICES [address] [place], [place], [address] Phone: [phone]   Fax:  [phone]  Speech Language Pathology Treatment  Patient Details  Name: Patricia [name] MRN: [id number] Date of Birth: 1934/09/17 Referring Provider: Jennings Books  Encounter Date: 07/12/2015      End of Session - 07/12/15 1512    Visit Number 15   Number of Visits 17   Date for SLP Re-Evaluation 07/21/15   SLP Start Time 1400   SLP Stop Time  1455   SLP Time Calculation (min) 55 min   Activity Tolerance Patient tolerated treatment well      Past Medical History  Diagnosis Date  . GERD (gastroesophageal reflux disease)   . Arthritis   . Hypertension   . Hypothyroidism   . Parkinson disease [facility])     Past Surgical History  Procedure Laterality Date  . Back surgery  2015  . Appendectomy  1959  . Thyroidectomy  1960  . Cholecystectomy  1998    There were no vitals filed for this visit.  Visit Diagnosis: Dysphonia      Subjective Assessment - 07/12/15 1509    Subjective "I'm naturally talking louder"   Currently in Pain? No/denies               ADULT SLP TREATMENT - 07/12/15 1508    General Information   Behavior/Cognition Alert;Cooperative;Pleasant mood   Treatment Provided   Treatment provided Cognitive-Linquistic   Pain Assessment   Pain Assessment No/denies pain   Cognitive-Linquistic Treatment   Treatment focused on Voice   Skilled Treatment Daily Task #1: Average 9 seconds, 84 dB. Daily Task 2: Highs: 15 high pitched "ah" given min cues. Lows: 15 low pitched "ah" given min cues. Daily task #3: Average 73 dB.  Hierarchal speech loudness drill: Read sentences, 74 dB.  Generate sentence given linguistic task, 72 dB.  Homework: assignments completed.  Off the cuff remarks: average 70 dB.   Assessment / Recommendations / Plan   Plan Continue with current plan of care   Progression Toward Goals   Progression  toward goals Progressing toward goals          SLP Education - 07/12/15 1510    Education provided Yes   Education Details LSVT-LOUD   Person(s) Educated Patient   Methods Explanation;Demonstration;Verbal cues;Handout   Comprehension Verbalized understanding;Returned demonstration;Verbal cues required;Need further instruction            SLP Long Term Goals - 07/05/15 1010    SLP LONG TERM GOAL #1   Title The patient will complete Daily Tasks (Maximum duration "ah", High/Lows, and Functional Phrases) at average loudness of 80 dB and with loud, good quality voice.    Time 4   Period Weeks   Status Partially Met   SLP LONG TERM GOAL #2   Title The patient will complete Hierarchal Speech Loudness reading drills (words/phrases, sentences, and paragraph) at average 75 dB and with loud, good quality voice.     Time 4   Period Weeks   Status Partially Met   SLP LONG TERM GOAL #3   Title The patient will complete homework daily.   Time 4   Period Weeks   Status On-going   SLP LONG TERM GOAL #4   Title The patient will participate in conversation, maintaining average loudness of 75 dB and loud, good quality voice.   Time 4   Period Weeks   Status Partially Met  Plan - 07/12/15 1515    Clinical Impression Statement The patient is completing daily tasks and hierarchal speech drill tasks with loud, good quality voice given fewer SLP cues for loudness and quality.  She is demonstrating increased generalization into conversational speech.   Speech Therapy Frequency 4x / week   Duration 4 weeks   Treatment/Interventions Other (comment)  LSVT-LOUD   Potential to Achieve Goals Good   Potential Considerations Ability to learn/carryover information;Cooperation/participation level;Previous level of function;Severity of impairments;Other (comment)   SLP Home Exercise Plan LSVT-LOUD daily homework   Consulted and Agree with Plan of Care Patient        Problem  List Patient Active Problem List   Diagnosis Date Noted  . Acquired hypothyroidism 12/23/2014  . Neuritis or radiculitis due to rupture of lumbar intervertebral disc 12/07/2014  . Lumbar canal stenosis 12/07/2014  . H/O surgical procedure 03/08/2014  . Carpal tunnel syndrome 12/03/2013  . Benign essential HTN 12/03/2013  . Degenerative joint disease involving multiple joints 12/03/2013  . Idiopathic Parkinson's disease ([place]) 12/03/2013   Leroy Sea, MS/CCC- SLP  [name] 07/12/2015, 3:16 PM  [place] MAIN [facility] SERVICES [address] [place], [place], [address] Phone: [phone]   Fax:  [phone]   Name: GENEVIEVE ARBAUGH MRN: [id number] Date of Birth: Oct 14, 1933

## 2015-07-13 ENCOUNTER — Ambulatory Visit: Payer: PPO | Admitting: Speech Pathology

## 2015-07-13 ENCOUNTER — Encounter: Payer: Self-pay | Admitting: Speech Pathology

## 2015-07-13 ENCOUNTER — Encounter: Payer: Self-pay | Admitting: Physical Therapy

## 2015-07-13 ENCOUNTER — Ambulatory Visit: Payer: PPO | Admitting: Physical Therapy

## 2015-07-13 DIAGNOSIS — R490 Dysphonia: Secondary | ICD-10-CM

## 2015-07-13 DIAGNOSIS — R531 Weakness: Secondary | ICD-10-CM

## 2015-07-13 DIAGNOSIS — R262 Difficulty in walking, not elsewhere classified: Secondary | ICD-10-CM

## 2015-07-13 NOTE — Therapy (Signed)
[facility] MAIN [facility] SERVICES [address] [place], [place], [address] Phone: [phone]   Fax:  [phone]  Physical Therapy Treatment  Patient Details  Name: Dwaine Galeudrey S Kunka MRN: [id number] Date of Birth: 04/23/1934 No Data Recorded  Encounter Date: 07/13/2015      PT End of Session - 07/13/15 1506    Visit Number 16   Number of Visits 17   Date for PT Re-Evaluation 07/18/15   PT Start Time 0300   PT Stop Time 0400   PT Time Calculation (min) 60 min   Activity Tolerance Patient tolerated treatment well   Behavior During Therapy [facility] for tasks assessed/performed      Past Medical History  Diagnosis Date  . GERD (gastroesophageal reflux disease)   . Arthritis   . Hypertension   . Hypothyroidism   . Parkinson disease [facility](HCC)     Past Surgical History  Procedure Laterality Date  . Back surgery  2015  . Appendectomy  1959  . Thyroidectomy  1960  . Cholecystectomy  1998    There were no vitals filed for this visit.  Visit Diagnosis:  Dysphonia  Difficulty walking  Weakness      Subjective Assessment - 07/13/15 1506    Subjective Patient is happy that it is her last day.   Currently in Pain? No/denies            TEST Outcome Interpretation  5 times sit<>stand 17.22 >79 yo, >15 sec indicates increased risk for falls  10 meter walk test 1. 0 m/s <1.0 m/s indicates increased risk for falls; limited community ambulator  Timed up and Go 10.74 sec <14 sec indicates increased risk for falls  6 minute walk test 950 Feet 1000 feet is community Financial controllerambulator  Berg Balance Assessment  <36/56 (100% risk for falls), 37-45 (80% risk for falls); 46-51 (>50% risk for falls); 52-55 (lower risk <25% of falls)  9 Hole Peg Test L:24.62 R:  25.70                                                     floor to ceiling x 10 reps, side to side 10 reps,  step and reach forward x 10 reps, step and reach backwards x 10, step and reach sideways x 10 , Rock and reach forward/backward x 10 , Rock and reach sideways x 10, functional tasks; 1 sit to stand 2. Reaching with cones to duplicate putting dishes away 3. hand coordination tasks 4. stepping up from foam to stool 5. washing hair and drying hair  Patient continues to demonstrates less incoordination of movement with select exercises such as rock and reach and stepping backwards  Min cueing needed to appropriately perform LSVT tasks with leg, hand, and head position. Decreased coordination demonstrated requiring consistent verbal cueing to correct form.  PT instructed Patient in standardized outcome measures to assess progress including the 5x STS, 10 m walk test, 6 minute walk test. See above for results                          PT Education - 07/13/15 1506    Education provided Yes   Education Details LSVT BIG   Person(s) Educated Patient   Methods Explanation   Comprehension Verbalized understanding  PT Long Term Goals - 15-Jul-2015 1508    PT LONG TERM GOAL #1   Status Achieved   PT LONG TERM GOAL #2   Status Achieved   PT LONG TERM GOAL #3   Status Achieved               Plan - 07-15-2015 1507    Clinical Impression Statement  Better consistent coordination demonstrated requiring consistent verbal cueing to correct form.   Pt will benefit from skilled therapeutic intervention in order to improve on the following deficits Abnormal gait;Difficulty walking;Decreased activity tolerance;Decreased balance;Decreased strength;Decreased mobility;Decreased coordination   Rehab Potential Good   PT Frequency 4x / week   PT Duration 4 weeks   PT Treatment/Interventions Balance training;Therapeutic exercise;Therapeutic activities;Functional mobility training;Stair training;Gait training;ADLs/Self Care Home Management   PT Next Visit Plan LSVT BIG           G-Codes - 07-15-2015 1509    Functional Assessment Tool Used 5 x sit to stand, tug, 6 MW   Functional Limitation Mobility: Walking and moving around   Mobility: Walking and Moving Around Goal Status [phone]) At least 20 percent but less than 40 percent impaired, limited or restricted   Mobility: Walking and Moving Around Discharge Status [phone]) 0 percent impaired, limited or restricted      Problem List Patient Active Problem List   Diagnosis Date Noted  . Acquired hypothyroidism 12/23/2014  . Neuritis or radiculitis due to rupture of lumbar intervertebral disc 12/07/2014  . Lumbar canal stenosis 12/07/2014  . H/O surgical procedure 03/08/2014  . Carpal tunnel syndrome 12/03/2013  . Benign essential HTN 12/03/2013  . Degenerative joint disease involving multiple joints 12/03/2013  . Idiopathic Parkinson's disease (HCC) 12/03/2013    Ezekiel Ina 07/15/15, 3:11 PM  [facility] MAIN [facility] SERVICES [address] [place], [place], [address] Phone: [phone]   Fax:  [phone]  Name: ROSHELL BRIGHAM MRN: [id number] Date of Birth: 1934/06/02

## 2015-07-13 NOTE — Therapy (Signed)
[place] MAIN [facility] SERVICES [address] [place], [place], [address] Phone: [phone]   Fax:  [phone]  Speech Language Pathology Treatment/Discharge Summary  Patient Details  Name: Patricia [name] MRN: [id number] Date of Birth: April 05, 1934 Referring Provider: Jennings Books  Encounter Date: 07/13/2015      End of Session - 07/13/15 1455    Visit Number 16   Number of Visits 17   Date for SLP Re-Evaluation 07/21/15   SLP Start Time 1400   SLP Stop Time  1455   SLP Time Calculation (min) 55 min   Activity Tolerance Patient tolerated treatment well      Past Medical History  Diagnosis Date  . GERD (gastroesophageal reflux disease)   . Arthritis   . Hypertension   . Hypothyroidism   . Parkinson disease [facility])     Past Surgical History  Procedure Laterality Date  . Back surgery  2015  . Appendectomy  1959  . Thyroidectomy  1960  . Cholecystectomy  1998    There were no vitals filed for this visit.  Visit Diagnosis: Dysphonia      Subjective Assessment - 07/13/15 1453    Subjective "I'm naturally talking louder"  The patient reports that family are not asking her to speak up or repeat herself as often.  She states that she will really try to use her LOUD non-raspy voice more often.   Currently in Pain? No/denies               ADULT SLP TREATMENT - 07/13/15 0001    General Information   Behavior/Cognition Alert;Cooperative;Pleasant mood   Treatment Provided   Treatment provided Cognitive-Linquistic   Pain Assessment   Pain Assessment No/denies pain   Cognitive-Linquistic Treatment   Treatment focused on Voice   Skilled Treatment Daily Task #1: Average 9 seconds, 84 dB. Daily Task 2: Highs: 15 high pitched "ah" given min cues. Lows: 15 low pitched "ah" given min cues. Daily task #3: Average 73 dB.  Hierarchal speech loudness drill: Read sentences, 74 dB.  Generate sentence given linguistic task, 72 dB.   Homework: assignments completed.  Off the cuff remarks: average 70 dB.   Assessment / Recommendations / Plan   Plan Discharge SLP treatment due to (comment)  Program completed   Progression Toward Goals   Progression toward goals Goals met, education completed, patient discharged from [place] Education - 07/13/15 1454    Education provided Yes   Education Details LSVT-LOUD   Person(s) Educated Patient   Methods Explanation;Demonstration;Handout   Comprehension Verbalized understanding;Returned demonstration            SLP Long Term Goals - 07/13/15 1457    SLP LONG TERM GOAL #1   Title The patient will complete Daily Tasks (Maximum duration "ah", High/Lows, and Functional Phrases) at average loudness of 80 dB and with loud, good quality voice.    Status Achieved   SLP LONG TERM GOAL #2   Title The patient will complete Hierarchal Speech Loudness reading drills (words/phrases, sentences, and paragraph) at average 75 dB and with loud, good quality voice.     Status Achieved   SLP LONG TERM GOAL #3   Title The patient will complete homework daily.   Status Achieved   SLP LONG TERM GOAL #4   Title The patient will participate in conversation, maintaining average loudness of 75 dB and loud, good quality voice.  Status Achieved          Plan - 2015/07/31 1455    Clinical Impression Statement The patient has completed the LSVT-LOUD program.  While she does not consistently meet criterion on tasks, she is demonstrating generalization of loud into natural contexts such as conversation with family/friends or conducting business over the phone.  The patient is completing daily tasks and hierarchal speech drill tasks with loud, good quality voice given occasional SLP cues for loudness and quality.  She is demonstrating increased generalization into conversational speech.   Speech Therapy Frequency 4x / week   Duration 4 weeks   Treatment/Interventions Other (comment)   LSVT-LOUD   Potential to Achieve Goals Good   Potential Considerations Ability to learn/carryover information;Cooperation/participation level;Previous level of function;Severity of impairments;Other (comment)   SLP Home Exercise Plan LSVT-LOUD daily homework   Consulted and Agree with Plan of Care Patient          G-Codes - 07-31-2015 1457    Functional Assessment Tool Used LSVT-LOUD daily treatment   Functional Limitations Voice   Voice Current Status (G9171) At least 1 percent but less than 20 percent impaired, limited or restricted   Voice Goal Status (O0370) At least 1 percent but less than 20 percent impaired, limited or restricted   Voice Discharge Status (W8889) At least 1 percent but less than 20 percent impaired, limited or restricted      Problem List Patient Active Problem List   Diagnosis Date Noted  . Acquired hypothyroidism 12/23/2014  . Neuritis or radiculitis due to rupture of lumbar intervertebral disc 12/07/2014  . Lumbar canal stenosis 12/07/2014  . H/O surgical procedure 03/08/2014  . Carpal tunnel syndrome 12/03/2013  . Benign essential HTN 12/03/2013  . Degenerative joint disease involving multiple joints 12/03/2013  . Idiopathic Parkinson's disease ([place]) 12/03/2013   Leroy Sea, MS/CCC- SLP  [name] 07-31-2015, 2:58 PM  [place] MAIN [facility] SERVICES [address] [place], [place], [address] Phone: [phone]   Fax:  [phone]   Name: Patricia [name] MRN: [id number] Date of Birth: 06-03-1934

## 2015-07-17 ENCOUNTER — Ambulatory Visit: Payer: PPO | Admitting: Physical Therapy

## 2015-07-17 ENCOUNTER — Encounter: Payer: PPO | Admitting: Speech Pathology

## 2015-07-18 ENCOUNTER — Encounter: Payer: PPO | Admitting: Speech Pathology

## 2015-07-18 ENCOUNTER — Ambulatory Visit: Payer: PPO | Admitting: Physical Therapy

## 2015-07-19 ENCOUNTER — Ambulatory Visit: Payer: PPO | Admitting: Physical Therapy

## 2015-07-19 ENCOUNTER — Encounter: Payer: PPO | Admitting: Speech Pathology

## 2015-07-19 DIAGNOSIS — R279 Unspecified lack of coordination: Secondary | ICD-10-CM

## 2015-07-19 DIAGNOSIS — R490 Dysphonia: Secondary | ICD-10-CM | POA: Diagnosis not present

## 2015-07-19 DIAGNOSIS — N8189 Other female genital prolapse: Secondary | ICD-10-CM

## 2015-07-19 NOTE — Patient Instructions (Addendum)
PELVIC FLOOR / KEGEL EXERCISES   Pelvic floor/ Kegel exercises are used to strengthen the muscles in the base of your pelvis that are responsible for supporting your pelvic organs and preventing urine/feces leakage. Based on your therapist's recommendations, they can be performed while standing, sitting, or lying down. Imagine pelvic floor area as a diamond with pelvic landmarks: top =pubic bone, bottom tip=tailbone, sides=sitting bones (ischial tuberosities).    Make yourself aware of this muscle group by using these cues while coordinating your breath:  Inhale, feel pelvic floor diamond area lower like hammock towards your feet and ribcage/belly expanding. Pause. Let the exhale naturally and feel your belly sink, abdominal muscles hugging in around you and you may notice the pelvic diamond draws upward towards your head forming a umbrella shape. Give a squeeze during the exhalation like you are stopping the flow of urine. If you are squeezing the buttock muscles, try to give 50% less effort.   Common Errors:  Breath holding: If you are holding your breath, you may be bearing down against your bladder instead of pulling it up. If you belly bulges up while you are squeezing, you are holding your breath. Be sure to breathe gently in and out while exercising. Counting out loud may help you avoid holding your breath.  Accessory muscle use: You should not see or feel other muscle movement when performing pelvic floor exercises. When done properly, no one can tell that you are performing the exercises. Keep the buttocks, belly and inner thighs relaxed.  Overdoing it: Your muscles can fatigue and stop working for you if you over-exercise. You may actually leak more or feel soreness at the lower abdomen or rectum.  YOUR HOME EXERCISE PROGRAM  LONG HOLDS: Position: on back   Inhale and then exhale. Then squeeze the muscle and count aloud for 10 seconds. Rest with three long breaths. (Be sure to let  belly sink in with exhales and not push outward)  Perform 10 repetitions, 3 times/day                DECREASE DOWNWARD PRESSURE ON  YOUR PELVIC FLOOR, ABDOMINAL, LOW BACK MUSCLES       PRESERVE YOUR PELVIC HEALTH LONG-TERM   ** SQUEEZE pelvic floor BEFORE YOUR SNEEZE, COUGH, LAUGH   ** EXHALE BEFORE YOU RISE AGAINST GRAVITY (lifting, sit to stand, from squat to stand)   ** LOG ROLL OUT OF BED INSTEAD OF CRUNCH/SIT-UP     _____________________________________________  Dr. Dalbert GarnetBeasley at [facility]   [phone]

## 2015-07-20 ENCOUNTER — Encounter: Payer: PPO | Admitting: Speech Pathology

## 2015-07-20 ENCOUNTER — Ambulatory Visit: Payer: PPO | Admitting: Physical Therapy

## 2015-07-20 NOTE — Therapy (Signed)
[place] [facility] MAIN [facility] SERVICES [address] [place], [place], [address] Phone: [phone]   Fax:  [phone]  Physical Therapy Evaluation  Patient Details  Name: Patricia [name] MRN: [id number] Date of Birth: [birth year] Referring Provider: Bethann Punches   Encounter Date: 07/19/2015      PT End of Session - 07/20/15 1840    Visit Number 1   Number of Visits 8   Date for PT Re-Evaluation 10/09/15   Authorization Type G-Code at 10th visit   PT Start Time 1005   PT Stop Time 1055   PT Time Calculation (min) 50 min      Past Medical History  Diagnosis Date  . GERD (gastroesophageal reflux disease)   . Arthritis   . Hypertension   . Hypothyroidism   . Parkinson disease [facility])     Past Surgical History  Procedure Laterality Date  . Back surgery  2015  . Appendectomy  1959  . Thyroidectomy  1960  . Cholecystectomy  1998    There were no vitals filed for this visit.  Visit Diagnosis:  Pelvic floor weakness  Lack of coordination      Subjective Assessment - 07/20/15 1645    Subjective Pt reports she noticed she has had urge incontinence with  nighttime leakage epsiodes every night  for 6 months. Pt states her pad gets soaked and has to change it  1x  per night but a few months ago, she was changing her pads 2x. Pt thinks maybe she was drinking more fluids during those times. Pt notices occasion leakage (droplet amount) during the daytime hours.denied SUI. Bowel movements occur daily but sometimes pt has to take Milk of Mg to eliminate. Pt reports she has to strain when she has bowel movements. Daily  fluid intake: 1 cup green tea, (2) 16  fl oz of water, 4 oz juice.  Pt drinks 8 fl oz glass of water at dinner 6 pm,   2-3 swallows of water at 7pm with medications at night. Pt goes to bed at night at 9pm. Pt reported she had noticed her vaginal tissues fused together and had asked her gynecologist regarding her condition but he was unable  to provide her any explanations. Pt requested another gynecologist referral.      Pertinent History Hx of Parkinsons (Dx 6 years ago), 5 vaginal deliveries,  Hx of OA in low back.    Patient Stated Goals 1) quit leakage             [facility] PT Assessment - 07/20/15 1833    Assessment   Medical Diagnosis Urinary Continence    Precautions   Precautions None   Home Environment   Living Environment Private residence   Living Arrangements Alone   Available Help at Discharge Family   Type of Home House   Home Access Stairs to enter   Prior Function   Level of Independence Independent   Observation/Other Assessments   Other Surveys  --  UDI-6 42%                  Pelvic Floor Special Questions - 07/20/15 1645    Urinary frequency neg   Perineal Body/Introitus other labia majora fused at middle section    External Palpation at ischial tuberosity, noted ability to lift pelvic floor 10 sec holds, 10 reps with adductor/abdominal compensations initially    Pelvic Floor Internal Exam pt consented verbally without contraindications noted. Pt stated she completed antibiotics  last week for an UTI.    Exam Type Vaginal  deferred due to labial adhesion          [facility] Adult PT Treatment/Exercise - 07/20/15 1833    Self-Care   Self-Care --  POC, goals, anatomy/physiology, fluid intake, referral to gy   Neuro Re-ed    Neuro Re-ed Details  pelvic floor strengthening   cues for less accessory mm use ( adductors, abdominals)                     PT Long Term Goals - 07/20/15 1834    PT LONG TERM GOAL #1   Title Pt will demo no accessory mm use with pelvic floor contractions with no cuing  in order to improve urinary function.   Time 12   Period Weeks   Status New   PT LONG TERM GOAL #2   Title Pt will decrease her UDI-6 score from 42% to < 35% in order to improve sleep and decrease leakage at night.   Time 12   Period Weeks   Status New   PT LONG TERM GOAL #3    Title Pt will demo deep core activation with Dynamic Stabilization exercises level 1-4 10 reps without cuing with proper form in order optimal postural mm for balance and gait.    Time 12   Period Weeks   Status New               Plan - 07/20/15 1842    Clinical Impression Statement Pt is a 81 yo female whose signs and symptoms consist of urge incontinence and nighttime leakage complaints, deep core coordination and pelvic floor deficits in addition to labial adhesion. These deficits interfere with her sleep and ADls. Pt has been referred to a gynecologist to assess labial adhesion.     Pt will benefit from skilled therapeutic intervention in order to improve on the following deficits Improper body mechanics;Decreased mobility;Decreased strength;Decreased endurance;Decreased coordination   Rehab Potential Good   Clinical Impairments Affecting Rehab Potential co-morbidities, Parkinsons    PT Frequency Biweekly   PT Duration 12 weeks   PT Treatment/Interventions ADLs/Self Care Home Management;Patient/family education;Stair training;Gait training;Therapeutic exercise;Therapeutic activities;Functional mobility training;Neuromuscular re-education;Biofeedback;Aquatic Therapy;Scar mobilization;Manual techniques;Balance training;Dry needling;Passive range of motion   Consulted and Agree with Plan of Care Patient         Problem List Patient Active Problem List   Diagnosis Date Noted  . Acquired hypothyroidism 12/23/2014  . Neuritis or radiculitis due to rupture of lumbar intervertebral disc 12/07/2014  . Lumbar canal stenosis 12/07/2014  . H/O surgical procedure 03/08/2014  . Carpal tunnel syndrome 12/03/2013  . Benign essential HTN 12/03/2013  . Degenerative joint disease involving multiple joints 12/03/2013  . Idiopathic Parkinson's disease (HCC) 12/03/2013    Mariane MastersYeung,Shin Yiing  ,PT, DPT, E-RYT  07/20/2015, 6:48 PM  [place] [facility]  SERVICES [address] [place], [place], [address] Phone: [phone]   Fax:  [phone]  Name: Patricia [name] MRN: [id number] Date of Birth: 05/07/1934

## 2015-07-24 ENCOUNTER — Encounter: Payer: PPO | Admitting: Speech Pathology

## 2015-07-24 ENCOUNTER — Ambulatory Visit: Payer: PPO | Admitting: Physical Therapy

## 2015-07-25 ENCOUNTER — Ambulatory Visit: Payer: PPO | Admitting: Physical Therapy

## 2015-07-25 ENCOUNTER — Encounter: Payer: PPO | Admitting: Speech Pathology

## 2015-07-26 ENCOUNTER — Encounter: Payer: PPO | Admitting: Speech Pathology

## 2015-07-26 ENCOUNTER — Ambulatory Visit: Payer: PPO | Admitting: Physical Therapy

## 2015-07-27 ENCOUNTER — Encounter: Payer: PPO | Admitting: Speech Pathology

## 2015-07-27 ENCOUNTER — Ambulatory Visit: Payer: PPO | Admitting: Physical Therapy

## 2015-07-31 ENCOUNTER — Ambulatory Visit: Payer: PPO | Admitting: Physical Therapy

## 2015-07-31 ENCOUNTER — Encounter: Payer: PPO | Admitting: Speech Pathology

## 2015-08-01 ENCOUNTER — Encounter: Payer: PPO | Admitting: Speech Pathology

## 2015-08-01 ENCOUNTER — Ambulatory Visit: Payer: PPO | Admitting: Physical Therapy

## 2015-08-01 ENCOUNTER — Ambulatory Visit: Payer: PPO | Attending: Internal Medicine | Admitting: Physical Therapy

## 2015-08-01 DIAGNOSIS — R279 Unspecified lack of coordination: Secondary | ICD-10-CM | POA: Insufficient documentation

## 2015-08-01 DIAGNOSIS — N8189 Other female genital prolapse: Secondary | ICD-10-CM | POA: Diagnosis present

## 2015-08-01 NOTE — Patient Instructions (Signed)
Seated pelvic floor holds 10 sec, 5 reps  (3x day)   Laying down 10 sec, 10 reps (3 x day)    Sit to stand exercise : put on yellow band above knee to prevent knees from bowing in and to exhale when you rise

## 2015-08-02 ENCOUNTER — Ambulatory Visit: Payer: PPO | Admitting: Physical Therapy

## 2015-08-02 ENCOUNTER — Encounter: Payer: PPO | Admitting: Speech Pathology

## 2015-08-02 NOTE — Therapy (Signed)
[facility] MAIN [facility] [address] [place], [place], [address] Phone: [phone]   Fax:  [phone]  Physical Therapy Treatment  Patient Details  Name: Patricia [name] MRN: [id number] Date of Birth: 09/24/1933 Referring Provider: Bethann PunchesMark Miller   Encounter Date: 08/01/2015      PT End of Session - 08/02/15 2037    Visit Number 2   Number of Visits 8   Date for PT Re-Evaluation 10/09/15   Authorization Type G-Code at 10th visit   PT Start Time 1100   PT Stop Time 1135   PT Time Calculation (min) 35 min      Past Medical History  Diagnosis Date  . GERD (gastroesophageal reflux disease)   . Arthritis   . Hypertension   . Hypothyroidism   . Parkinson disease [facility](HCC)     Past Surgical History  Procedure Laterality Date  . Back surgery  2015  . Appendectomy  1959  . Thyroidectomy  1960  . Cholecystectomy  1998    There were no vitals filed for this visit.  Visit Diagnosis:  Pelvic floor weakness  Lack of coordination      Subjective Assessment - 08/02/15 2037    Subjective Pt reported she has been performing her exercises and last night, she noted she only had to get up one time with very minor leakage.    Pertinent History Hx of Parkinsons (Dx 6 years ago), 5 vaginal deliveries,  Hx of OA in low back.    Patient Stated Goals 1) quit leakage             [facility] Assessment - 08/02/15 2034    Assessment   Medical Diagnosis Urinary Continence    Sit to Stand   Comments noted genu valgus,  able to maintain proper alignment with yellow band     Transfers   Five time sit to stand comments  23.81 sec with exhalation                   Pelvic Floor Special Questions - 08/02/15 2035    External Palpation seated: pelvic contractions 10 sec, 5 reps  with minor cuing           [facility] Treatment/Exercise - 08/02/15 2034    Therapeutic Activites    Therapeutic Activities ADL's   ADL's --   sit-to-stand with exhalation 10 reps   Neuro Re-ed    Neuro Re-ed Details  pelvic floor squeezes 10 sec holds, 5 reps seated with cues to coordinate deep core mm                PT Education - 08/02/15 2036    Education provided Yes   Education Details hep   Person(s) Educated Patient   Methods Explanation;Demonstration;Tactile cues;Verbal cues;Handout   Comprehension Verbalized understanding;Returned demonstration             PT Long Term Goals - 08/02/15 2042    PT LONG TERM GOAL #1   Title Pt will demo no accessory mm use with pelvic floor contractions with no cuing  in order to improve urinary function.   Time 12   Period Weeks   Status Achieved   PT LONG TERM GOAL #2   Title Pt will decrease her UDI-6 score from 42% to < 35% in order to improve sleep and decrease leakage at night.   Time 12   Period Weeks   Status On-going   PT LONG  TERM GOAL #3   Title Pt will demo deep core activation with Dynamic Stabilization exercises level 1-4 10 reps without cuing with proper form in order optimal postural mm for balance and gait.    Time 12   Period Weeks   Status New   PT LONG TERM GOAL #4   Title Pt will report nocturia 1x/ night for 1 week in order to improve sleep.    Time 12   Period Weeks   Status New               Plan - 08/02/15 2037    Clinical Impression Statement Pt is progressing well towards her goal with decreased nocturia episodes and advanced towards seated pelvic contractions to 5 reps, 10 sec holds. Also initiated application of deep core activation with functional activities like sit to stand which pt demo'd without difficulty. Plan to incorporate more deep core activation principles/techniques into her current exercises that she learned through her Parkinsons program. Pt will continue to benefit from PT.      Pt will benefit from skilled therapeutic intervention in order to improve on the following deficits Improper body  mechanics;Decreased mobility;Decreased strength;Decreased endurance;Decreased coordination   Rehab Potential Good   Clinical Impairments Affecting Rehab Potential co-morbidities, Parkinsons    PT Frequency Biweekly   PT Duration 12 weeks   PT Treatment/Interventions ADLs/Self Care Home Management;Patient/family education;Stair training;Gait training;Therapeutic exercise;Therapeutic activities;Functional mobility training;Neuromuscular re-education;Biofeedback;Aquatic Therapy;Scar mobilization;Manual techniques;Balance training;Dry needling;Passive range of motion   Consulted and Agree with Plan of Care Patient        Problem List Patient Active Problem List   Diagnosis Date Noted  . Acquired hypothyroidism 12/23/2014  . Neuritis or radiculitis due to rupture of lumbar intervertebral disc 12/07/2014  . Lumbar canal stenosis 12/07/2014  . H/O surgical procedure 03/08/2014  . Carpal tunnel syndrome 12/03/2013  . Benign essential HTN 12/03/2013  . Degenerative joint disease involving multiple joints 12/03/2013  . Idiopathic Parkinson's disease (HCC) 12/03/2013    Elisha Ponder, DPT, E-RYT   08/02/2015, 8:54 PM  [place] [facility] MAIN [facility] SERVICES [address] [place], [place], [address] Phone: [phone]   Fax:  [phone]  Name: Patricia [name] MRN: [id number] Date of Birth: 05/20/1934

## 2015-08-03 ENCOUNTER — Ambulatory Visit: Payer: PPO | Admitting: Physical Therapy

## 2015-08-03 ENCOUNTER — Encounter: Payer: PPO | Admitting: Speech Pathology

## 2015-08-07 ENCOUNTER — Ambulatory Visit: Payer: PPO | Admitting: Physical Therapy

## 2015-08-07 ENCOUNTER — Encounter: Payer: PPO | Admitting: Speech Pathology

## 2015-08-08 ENCOUNTER — Ambulatory Visit: Payer: PPO | Admitting: Physical Therapy

## 2015-08-08 ENCOUNTER — Encounter: Payer: PPO | Admitting: Speech Pathology

## 2015-08-09 ENCOUNTER — Ambulatory Visit: Payer: PPO | Admitting: Physical Therapy

## 2015-08-09 ENCOUNTER — Encounter: Payer: PPO | Admitting: Speech Pathology

## 2015-08-10 ENCOUNTER — Ambulatory Visit
Admission: RE | Admit: 2015-08-10 | Discharge: 2015-08-10 | Disposition: A | Discharge status: Home / Self Care | Payer: PPO | Source: Ambulatory Visit | Attending: Internal Medicine | Admitting: Internal Medicine

## 2015-08-10 ENCOUNTER — Ambulatory Visit: Payer: PPO | Admitting: Physical Therapy

## 2015-08-10 ENCOUNTER — Encounter: Payer: PPO | Admitting: Speech Pathology

## 2015-08-10 DIAGNOSIS — Z1231 Encounter for screening mammogram for malignant neoplasm of breast: Secondary | ICD-10-CM | POA: Diagnosis present

## 2015-08-15 ENCOUNTER — Ambulatory Visit: Payer: PPO | Admitting: Physical Therapy

## 2015-08-15 DIAGNOSIS — N8189 Other female genital prolapse: Secondary | ICD-10-CM | POA: Diagnosis not present

## 2015-08-15 DIAGNOSIS — R279 Unspecified lack of coordination: Secondary | ICD-10-CM

## 2015-08-15 NOTE — Therapy (Signed)
[place] [facility] MAIN [facility] SERVICES [address] [place], [place], [address] Phone: [phone]   Fax:  [phone]  Physical Therapy Treatment  / Discharge Summary  Patient Details  Name: Patricia [name] MRN: [id number] Date of Birth: 12/26/1933 Referring Provider: Bethann Punches   Encounter Date: 08/15/2015      PT End of Session - 08/15/15 1223    Visit Number 3   Number of Visits 8   Date for PT Re-Evaluation 10/09/15   Authorization Type G-Code at 10th visit   PT Start Time 1115   PT Stop Time 1150   PT Time Calculation (min) 35 min      Past Medical History  Diagnosis Date  . GERD (gastroesophageal reflux disease)   . Arthritis   . Hypertension   . Hypothyroidism   . Parkinson disease [facility])     Past Surgical History  Procedure Laterality Date  . Back surgery  2015  . Appendectomy  1959  . Thyroidectomy  1960  . Cholecystectomy  1998    There were no vitals filed for this visit.  Visit Diagnosis:  Pelvic floor weakness  Lack of coordination      Subjective Assessment - 08/15/15 1114    Subjective Pt reported decreased night time leakage from 2 pads / night and now changes 1 pad 1 night out of 7 days. Pt reported she would like to go over the pelvic floor co-activation with sit to stand.    Pertinent History Hx of Parkinsons (Dx 6 years ago), 5 vaginal deliveries,  Hx of OA in low back.    Patient Stated Goals 1) quit leakage             [facility] PT Assessment - 08/15/15 1117    Observation/Other Assessments   Other Surveys  --  UDI 50% (07/19/15) to 41/.6% (08/15/15)     Sit to Stand   Comments no genu valgus   pt reports she has been practicing knee aliignment   Transfers   Five time sit to stand comments  20.85   with exhalation on rise                      [facility] Adult PT Treatment/Exercise - 08/15/15 1117    Therapeutic Activites    Lifting demo'd liting simulated grocery bag with 5# weight on  exhale   Other Therapeutic Activities demo'd correction with Parkinsons exercise                PT Education - 08/15/15 1203    Education provided Yes   Education Details HEP, D/C   Person(s) Educated Patient   Methods Explanation;Demonstration;Tactile cues;Verbal cues   Comprehension Verbalized understanding;Returned demonstration;Verbal cues required             PT Long Term Goals - 08/15/15 1218    PT LONG TERM GOAL #1   Title Pt will demo no accessory mm use with pelvic floor contractions with no cuing  in order to improve urinary function.   Time 12   Period Weeks   Status Achieved   PT LONG TERM GOAL #2   Title Pt will decrease her UDI-6 score from 50% to < 45% in order to improve sleep and decrease leakage at  night.   (pt answered skipped question from 07/19/15 questionnaire for corrected score 50% on 08/15/15.  Scored 41.6%)     Time 12   Period Weeks   Status Achieved  PT LONG TERM GOAL #3   Title Pt will demo deep core activation with Dynamic Stabilization exercises level 1-4 10 reps without cuing with proper form in order optimal postural mm for balance and gait.    Time 12   Period Weeks   Status Deferred   PT LONG TERM GOAL #4   Title Pt will report nocturia 1x/ night for 1 week in order to improve sleep.    Time 12   Period Weeks   Status Achieved               Plan - 08/15/15 1211    Clinical Impression Statement Across 3 visits with pelvic health physical therapy, pt reports she has improved "A Very Great Deal Better" [facility] the [facility]. Pt has achieved 100% of her goals and has decreased her nighttime leakage from 2 pad changes per night to 1 pad change per week. Pt shows ability to apply deep core mm activation with functional activities and exercises from her Parkinsons Program  which she practices daily.  Pt is ready for D/C. Pt will be seeing Dr. Dalbert GarnetBeasley, gynecologist on Dec 5 re: labial adhesion.       Pt will benefit from skilled therapeutic intervention in order to improve on the following deficits Improper body mechanics;Decreased mobility;Decreased strength;Decreased endurance;Decreased coordination   Rehab Potential Good   Clinical Impairments Affecting Rehab Potential co-morbidities, Parkinsons    PT Frequency Biweekly   PT Duration 12 weeks   PT Treatment/Interventions ADLs/Self Care Home Management;Patient/family education;Stair training;Gait training;Therapeutic exercise;Therapeutic activities;Functional mobility training;Neuromuscular re-education;Biofeedback;Aquatic Therapy;Scar mobilization;Manual techniques;Balance training;Dry needling;Passive range of motion   Consulted and Agree with Plan of Care Patient          G-Codes - 08/15/15 1134    Functional Assessment Tool Used UDI-6, 5 STS   Functional Limitation Self care   Self Care Current Status (U9811(G8987) At least 40 percent but less than 60 percent impaired, limited or restricted   Self Care Goal Status (B1478(G8988) At least 20 percent but less than 40 percent impaired, limited or restricted   Self Care Discharge Status [phone](G8989) At least 20 percent but less than 40 percent impaired, limited or restricted      Problem List Patient Active Problem List   Diagnosis Date Noted  . Acquired hypothyroidism 12/23/2014  . Neuritis or radiculitis due to rupture of lumbar intervertebral disc 12/07/2014  . Lumbar canal stenosis 12/07/2014  . H/O surgical procedure 03/08/2014  . Carpal tunnel syndrome 12/03/2013  . Benign essential HTN 12/03/2013  . Degenerative joint disease involving multiple joints 12/03/2013  . Idiopathic Parkinson's disease (HCC) 12/03/2013    Mariane MastersYeung,Shin Yiing ,PT, DPT, E-RYT  08/15/2015, 12:24 PM  [facility] [address] [place], [place], [address] Phone: [phone]   Fax:  [phone]  Name: Patricia [name] MRN: [id number] Date of Birth:  11/06/1933

## 2015-08-29 ENCOUNTER — Encounter: Payer: PPO | Admitting: Physical Therapy

## 2015-08-31 NOTE — H&P (Signed)
Ms. Patricia [name] is a 81 y.o. female here for preoperative visit.  Pt with increasing labial adhesions over the last year, treated with estrogen cream, now with complete vaginal agglutination with a small opening that allows urine out. I am unable to find this space in the office and am concerned for complete urethral blockage in the near future.  Past Medical History:  has a past medical history of Back pain; Carpal tunnel syndrome (12/03/2013); Cataract; Chickenpox; COPD (chronic obstructive pulmonary disease); Diplopia (12/03/2013); Essential hypertension, benign (12/03/2013); GERD (gastroesophageal reflux disease); [id number](387.5Headache(784.0) (12/03/2013); Osteoarthrosis involving, or with mention of more than one site, but not specified as generalized, multiple sites (12/03/2013); Osteoporosis, post-menopausal; Parkinson's disease (12/03/2013); Tremor; and Unspecified hypothyroidism (12/03/2013).  Past Surgical History:  has a past surgical history that includes Insertion Intraocular Lens Prosthesis Secondary Implant; Appendectomy For Ruptured Appendix; Cholecystectomy open; Thyroidectomy Total; and t12 kYPHOPLASTY (02/22/14). Family History: family history includes Heart attack in her father; Hypertension in her father; Stroke in her father. Social History:  reports that she has never smoked. She has never used smokeless tobacco. She reports that she does not drink alcohol or use illicit drugs. OB/GYN History:  OB History    Gravida Para Term Preterm AB TAB SAB Ectopic Multiple Living   4 4 4       5       Allergies: is allergic to ace inhibitors; hydrocodone-acetaminophen; mucinex [guaifenesin]; sulfa (sulfonamide antibiotics); tramadol; and zoloft [sertraline]. Medications:  Current Outpatient Prescriptions:  . albuterol 90 mcg/actuation inhaler, Inhale 2 inhalations into the lungs every 6 (six) hours as needed for Wheezing., Disp: , Rfl:  . ALPRAZolam (XANAX) 0.25 MG tablet, Take 1 tablet  (0.25 mg total) by mouth 2 (two) times daily as needed for Sleep., Disp: 180 tablet, Rfl: 1 . aspirin 81 MG EC tablet, Take 81 mg by mouth once daily., Disp: , Rfl:  . budesonide-formoterol (SYMBICORT) 160-4.5 mcg/actuation inhaler, Inhale 2 inhalations into the lungs 2 (two) times daily as needed. , Disp: , Rfl:  . carbidopa-levodopa (SINEMET CR) 50-200 mg CR tablet, Take 1 tablet by mouth 4 (four) times daily., Disp: 360 tablet, Rfl: 3 . cholecalciferol (VITAMIN D3) 1,000 unit capsule, Take 1,000 Units by mouth once daily., Disp: , Rfl:  . cyanocobalamin (VITAMIN B12) 1000 MCG tablet, Take 1,000 mcg by mouth once daily., Disp: , Rfl:  . donepezil (ARICEPT) 10 MG tablet, Take 10 mg by mouth nightly., Disp: , Rfl:  . ibuprofen (ADVIL,MOTRIN) 200 MG tablet, Take 200 mg by mouth every 6 (six) hours as needed for Pain., Disp: , Rfl:  . levothyroxine (SYNTHROID, LEVOTHROID) 100 MCG tablet, Take 1 tablet (100 mcg total) by mouth once daily. Take on an empty stomach with a glass of water at least 30-60 minutes before breakfast., Disp: 90 tablet, Rfl: 3 . losartan-hydrochlorothiazide (HYZAAR) 100-12.5 mg tablet, Take 0.5 tablets by mouth 2 (two) times daily. (Patient taking differently: Take 0.5 tablets by mouth once daily. ), Disp: 90 tablet, Rfl: 3 . MULTIVITAMIN ORAL, Take by mouth., Disp: , Rfl:  . omeprazole (PRILOSEC) 20 MG DR capsule, Take 1 capsule (20 mg total) by mouth once daily., Disp: 90 capsule, Rfl: 3   Review of Systems: General:   No fatigue or weight loss Eyes:   No vision changes Ears:   No hearing difficulty Respiratory:   No cough or shortness of breath Pulmonary:   No asthma or shortness of breath Cardiovascular:  No chest pain, palpitations, dyspnea on exertion Gastrointestinal:  No abdominal bloating, chronic diarrhea, constipations, masses, pain or hematochezia Genitourinary:  No hematuria, dysuria, abnormal vaginal discharge, pelvic pain, Lymphatic:  No swollen lymph  nodes Musculoskeletal: No muscle weakness Neurologic:  No extremity weakness, syncope, seizure disorder Psychiatric:  No history of depression, delusions or suicidal/homicidal ideation   Exam:      Vitals:   08/31/15 1518  BP: 136/87  Pulse: 94   Body mass index is 27.56 kg/(m^2).  WDWN white  female in NAD Lungs: CTA  CV : RRR without murmur  Breast: deferred Neck: no thyromegaly Abdomen: soft , no mass, normal active bowel sounds, non-tender, no rebound tenderness Skin: No rashes, ulcers or skin lesions noted. No excessive hirsutism or acne noted.  Neurological: Appears alert and oriented and is a good historian. No gross abnormalities are noted. Psychological: Normal affect and mood. No signs of anxiety or depression noted.   Pelvic:  External genitalia: vulva completely closed with no ability to pass Q-tip. Non-tender. No odor. Tanner stage 5  Rectovaginal: external exam normal    Impression:   The primary encounter diagnosis was Vaginal adhesions, acquired. A diagnosis of Lichen sclerosus was also pertinent to this visit.    Plan:   - Preoperative visit: Patient returns for a preoperative discussion regarding her plans to proceed with surgical treatment of her labial agglutination by labial adhesiolysis procedure. The patient and I discussed the technical aspects of the procedure including the potential for risks and complications. These include but are not limited to the risk of infection requiring post-operative antibiotics or further procedures. We talked about the risk of injury to adjacent organs including bladder, bowel, ureter, blood vessels or nerves. We talked about the need to convert to an open incision. We talked about the possible need for blood transfusion. We talked aboutpostop complications such asthromboembolic or cardiopulmonary complications. All of her questions were answered. Her preoperative exam was completed and the  appropriate consents were signed. She is scheduled to undergo this procedure tomorrow.   Likely overnight stay with packing and catheter. Will address at postop check.

## 2015-09-01 ENCOUNTER — Ambulatory Visit: Payer: PPO | Admitting: Certified Registered Nurse Anesthetist

## 2015-09-01 ENCOUNTER — Encounter
Admission: RE | Admit: 2015-09-01 | Discharge: 2015-09-01 | Disposition: A | Discharge status: Home / Self Care | Payer: PPO | Source: Ambulatory Visit | Attending: Obstetrics and Gynecology | Admitting: Obstetrics and Gynecology

## 2015-09-01 ENCOUNTER — Ambulatory Visit
Admission: RE | Admit: 2015-09-01 | Discharge: 2015-09-01 | Disposition: A | Discharge status: Home / Self Care | Payer: PPO | Source: Ambulatory Visit | Attending: Obstetrics and Gynecology | Admitting: Obstetrics and Gynecology

## 2015-09-01 ENCOUNTER — Encounter
Admission: RE | Disposition: A | Discharge status: Home / Self Care | Payer: Self-pay | Source: Ambulatory Visit | Attending: Obstetrics and Gynecology

## 2015-09-01 DIAGNOSIS — R251 Tremor, unspecified: Secondary | ICD-10-CM | POA: Diagnosis not present

## 2015-09-01 DIAGNOSIS — Z888 Allergy status to other drugs, medicaments and biological substances status: Secondary | ICD-10-CM | POA: Diagnosis not present

## 2015-09-01 DIAGNOSIS — K219 Gastro-esophageal reflux disease without esophagitis: Secondary | ICD-10-CM | POA: Diagnosis not present

## 2015-09-01 DIAGNOSIS — Z823 Family history of stroke: Secondary | ICD-10-CM | POA: Insufficient documentation

## 2015-09-01 DIAGNOSIS — G20 Parkinson's disease: Secondary | ICD-10-CM | POA: Diagnosis not present

## 2015-09-01 DIAGNOSIS — J449 Chronic obstructive pulmonary disease, unspecified: Secondary | ICD-10-CM | POA: Diagnosis not present

## 2015-09-01 DIAGNOSIS — Z8249 Family history of ischemic heart disease and other diseases of the circulatory system: Secondary | ICD-10-CM | POA: Insufficient documentation

## 2015-09-01 DIAGNOSIS — Z9049 Acquired absence of other specified parts of digestive tract: Secondary | ICD-10-CM | POA: Diagnosis not present

## 2015-09-01 DIAGNOSIS — Z7982 Long term (current) use of aspirin: Secondary | ICD-10-CM | POA: Insufficient documentation

## 2015-09-01 DIAGNOSIS — E890 Postprocedural hypothyroidism: Secondary | ICD-10-CM | POA: Diagnosis not present

## 2015-09-01 DIAGNOSIS — I10 Essential (primary) hypertension: Secondary | ICD-10-CM | POA: Insufficient documentation

## 2015-09-01 DIAGNOSIS — Z79899 Other long term (current) drug therapy: Secondary | ICD-10-CM | POA: Diagnosis not present

## 2015-09-01 DIAGNOSIS — Z7951 Long term (current) use of inhaled steroids: Secondary | ICD-10-CM | POA: Diagnosis not present

## 2015-09-01 DIAGNOSIS — M810 Age-related osteoporosis without current pathological fracture: Secondary | ICD-10-CM | POA: Diagnosis not present

## 2015-09-01 DIAGNOSIS — H532 Diplopia: Secondary | ICD-10-CM | POA: Insufficient documentation

## 2015-09-01 DIAGNOSIS — Z882 Allergy status to sulfonamides status: Secondary | ICD-10-CM | POA: Diagnosis not present

## 2015-09-01 DIAGNOSIS — N895 Stricture and atresia of vagina: Secondary | ICD-10-CM | POA: Diagnosis not present

## 2015-09-01 DIAGNOSIS — G5600 Carpal tunnel syndrome, unspecified upper limb: Secondary | ICD-10-CM | POA: Insufficient documentation

## 2015-09-01 DIAGNOSIS — Q525 Fusion of labia: Secondary | ICD-10-CM | POA: Diagnosis present

## 2015-09-01 HISTORY — PX: LYSIS OF ADHESION: SHX5961

## 2015-09-01 LAB — BASIC METABOLIC PANEL
ANION GAP: 9 (ref 5–15)
BUN: 24 mg/dL — ABNORMAL HIGH (ref 6–20)
CHLORIDE: 102 mmol/L (ref 101–111)
CO2: 29 mmol/L (ref 22–32)
Calcium: 8.9 mg/dL (ref 8.9–10.3)
Creatinine, Ser: 1.08 mg/dL — ABNORMAL HIGH (ref 0.44–1.00)
GFR calc non Af Amer: 48 mL/min — ABNORMAL LOW (ref >60)
GFR, EST AFRICAN AMERICAN: 55 mL/min — AB (ref >60)
GLUCOSE: 99 mg/dL (ref 65–99)
Potassium: 4 mmol/L (ref 3.5–5.1)
Sodium: 140 mmol/L (ref 135–145)

## 2015-09-01 LAB — URINALYSIS COMPLETE WITH MICROSCOPIC (ARMC ONLY)
BILIRUBIN URINE: NEGATIVE
Bacteria, UA: NONE SEEN
GLUCOSE, UA: NEGATIVE mg/dL
HGB URINE DIPSTICK: NEGATIVE
KETONES UR: NEGATIVE mg/dL
LEUKOCYTES UA: NEGATIVE
NITRITE: NEGATIVE
Protein, ur: NEGATIVE mg/dL
SPECIFIC GRAVITY, URINE: 1.011 (ref 1.005–1.030)
pH: 6 (ref 5.0–8.0)

## 2015-09-01 LAB — TYPE AND SCREEN
ABO/RH(D): O POS
ANTIBODY SCREEN: NEGATIVE

## 2015-09-01 LAB — CBC
HCT: 39.3 % (ref 35.0–47.0)
HEMOGLOBIN: 12.8 g/dL (ref 12.0–16.0)
MCH: 27.9 pg (ref 26.0–34.0)
MCHC: 32.7 g/dL (ref 32.0–36.0)
MCV: 85.4 fL (ref 80.0–100.0)
Platelets: 261 10*3/uL (ref 150–440)
RBC: 4.6 MIL/uL (ref 3.80–5.20)
RDW: 15.1 % — ABNORMAL HIGH (ref 11.5–14.5)
WBC: 7.6 10*3/uL (ref 3.6–11.0)

## 2015-09-01 LAB — PROTIME-INR
INR: 1.11
Prothrombin Time: 14.5 seconds (ref 11.4–15.0)

## 2015-09-01 LAB — APTT: aPTT: 36 seconds (ref 24–36)

## 2015-09-01 LAB — ABO/RH: ABO/RH(D): O POS

## 2015-09-01 SURGERY — LAPAROTOMY, FOR LYSIS OF ADHESIONS
Anesthesia: General

## 2015-09-01 MED ORDER — PROPOFOL 10 MG/ML IV BOLUS
INTRAVENOUS | Status: DC | PRN
  Administered 2015-09-01: 130 mg via INTRAVENOUS

## 2015-09-01 MED ORDER — LACTATED RINGERS IV SOLN
INTRAVENOUS | Status: DC
  Administered 2015-09-01: 13:00:00 via INTRAVENOUS

## 2015-09-01 MED ORDER — ESTROGENS, CONJUGATED 0.625 MG/GM VA CREA
TOPICAL_CREAM | VAGINAL | Status: AC
  Filled 2015-09-01: qty 30

## 2015-09-01 MED ORDER — CEFAZOLIN SODIUM 1-5 GM-% IV SOLN
1.0000 g | INTRAVENOUS | Status: AC
  Administered 2015-09-01: 1 g via INTRAVENOUS

## 2015-09-01 MED ORDER — OXYCODONE HCL 5 MG PO TABS: 5.0000 mg | ORAL_TABLET | Freq: Four times a day (QID) | ORAL | Status: AC | PRN

## 2015-09-01 MED ORDER — MIDAZOLAM HCL 2 MG/2ML IJ SOLN
INTRAMUSCULAR | Status: DC | PRN
  Administered 2015-09-01: .5 mg via INTRAVENOUS

## 2015-09-01 MED ORDER — DEXAMETHASONE SODIUM PHOSPHATE 10 MG/ML IJ SOLN
INTRAMUSCULAR | Status: DC | PRN
  Administered 2015-09-01: 5 mg via INTRAVENOUS

## 2015-09-01 MED ORDER — CEFAZOLIN SODIUM-DEXTROSE 2-3 GM-% IV SOLR
INTRAVENOUS | Status: AC
  Administered 2015-09-01: 2 g
  Filled 2015-09-01: qty 50

## 2015-09-01 MED ORDER — CEFAZOLIN SODIUM 1-5 GM-% IV SOLN
INTRAVENOUS | Status: AC
  Administered 2015-09-01: 1 g via INTRAVENOUS
  Filled 2015-09-01: qty 50

## 2015-09-01 MED ORDER — LIDOCAINE-EPINEPHRINE 1 %-1:100000 IJ SOLN
INTRAMUSCULAR | Status: AC
  Filled 2015-09-01: qty 1

## 2015-09-01 MED ORDER — ONDANSETRON HCL 4 MG/2ML IJ SOLN
INTRAMUSCULAR | Status: DC | PRN
  Administered 2015-09-01: 4 mg via INTRAVENOUS

## 2015-09-01 MED ORDER — ESTROGENS, CONJUGATED 0.625 MG/GM VA CREA
TOPICAL_CREAM | VAGINAL | Status: DC | PRN
  Administered 2015-09-01: 1 via VAGINAL

## 2015-09-01 MED ORDER — LIDOCAINE HCL (CARDIAC) 20 MG/ML IV SOLN
INTRAVENOUS | Status: DC | PRN
  Administered 2015-09-01: 60 mg via INTRAVENOUS

## 2015-09-01 MED ORDER — FENTANYL CITRATE (PF) 100 MCG/2ML IJ SOLN
INTRAMUSCULAR | Status: DC | PRN
  Administered 2015-09-01 (×4): 25 ug via INTRAVENOUS

## 2015-09-01 MED ORDER — LIDOCAINE-EPINEPHRINE 1 %-1:100000 IJ SOLN
INTRAMUSCULAR | Status: DC | PRN
  Administered 2015-09-01: 6.5 mL

## 2015-09-01 MED ORDER — IBUPROFEN 600 MG PO TABS: 600.0000 mg | ORAL_TABLET | Freq: Four times a day (QID) | ORAL | Status: DC | PRN

## 2015-09-01 SURGICAL SUPPLY — 24 items
BLADE SURG 15 STRL LF DISP TIS (BLADE) ×1 IMPLANT
BLADE SURG 15 STRL SS (BLADE) ×2
CANISTER SUCT 1200ML W/VALVE (MISCELLANEOUS) ×3 IMPLANT
CATH ROBINSON RED A/P 16FR (CATHETERS) ×3 IMPLANT
DRAPE LEGGINS SURG 28X43 STRL (DRAPES) ×3 IMPLANT
DRAPE UNDER BUTTOCK W/FLU (DRAPES) ×3 IMPLANT
GLOVE BIO SURGEON STRL SZ 6.5 (GLOVE) ×2 IMPLANT
GLOVE BIO SURGEONS STRL SZ 6.5 (GLOVE) ×1
GLOVE INDICATOR 7.0 STRL GRN (GLOVE) ×3 IMPLANT
GOWN STRL REUS W/ TWL LRG LVL3 (GOWN DISPOSABLE) ×2 IMPLANT
GOWN STRL REUS W/TWL LRG LVL3 (GOWN DISPOSABLE) ×4
KIT RM TURNOVER CYSTO AR (KITS) ×3 IMPLANT
NDL SAFETY 22GX1.5 (NEEDLE) ×3 IMPLANT
NS IRRIG 500ML POUR BTL (IV SOLUTION) ×3 IMPLANT
PACK BASIN MINOR ARMC (MISCELLANEOUS) ×3 IMPLANT
PAD OB MATERNITY 4.3X12.25 (PERSONAL CARE ITEMS) ×3 IMPLANT
PAD PREP 24X41 OB/GYN DISP (PERSONAL CARE ITEMS) ×3 IMPLANT
SOL PREP PVP 2OZ (MISCELLANEOUS) ×3
SOLUTION PREP PVP 2OZ (MISCELLANEOUS) ×1 IMPLANT
SUT CHROMIC 3 0 SH 27 (SUTURE) IMPLANT
SUT VIC AB 2-0 SH 27 (SUTURE) ×2
SUT VIC AB 2-0 SH 27XBRD (SUTURE) ×1 IMPLANT
SYRINGE 10CC LL (SYRINGE) ×3 IMPLANT
TOWEL OR 17X26 4PK STRL BLUE (TOWEL DISPOSABLE) ×3 IMPLANT

## 2015-09-01 NOTE — Discharge Instructions (Signed)
Signs and Symptoms to Report Call our office at [phone] if you have any of the following.   Fever over 100.4 degrees or higher  Severe stomach pain not relieved with pain medications  Bright red bleeding thats heavier than a period that does not slow with rest  To go the bathroom a lot (frequency), you cant hold your urine (urgency), or it hurts when you empty your bladder (urinate)  Chest pain  Shortness of breath  Pain in the calves of your legs  Severe nausea and vomiting not relieved with anti-nausea medications  Signs of infection around your wounds, such as redness, hot to touch, swelling, green/yellow drainage (like pus), bad smelling discharge  Any concerns  What You Can Expect after Surgery  You may see some pink tinged, bloody fluid and bruising around the wound. This is normal. You may have a sore throat because of the tube in your mouth during general anesthesia. This will go away in 2 to 3 days.  You may have some stomach cramps.  You may notice spotting on your panties.  You may have pain around the incision sites.   Activities after Your Discharge Follow these guidelines to help speed your recovery at home: Apply estrogen cream three times a day to your vaginal opening. Pass one finger through the opening into the vaginal canal to make sure this area stays open.  Dont drive if you are in pain or taking narcotic pain medicine. You may drive when you can safely slam on the brakes, turn the wheel forcefully, and rotate your torso comfortably. This is typically 1 week. Practice in a parking lot or side street prior to attempting to drive regularly.   Ask others to help with household chores for 4 weeks.  Dont do strenuous activities, exercises, or sports like vacuuming, tennis, squash, etc. until your doctor says it is safe to do so. ---do not have intercourse for 8-10 weeks.   Walk as you feel able. Rest often since it may take two  weeks for your  energy level to return to normal.   You may climb stairs  Avoid constipation:   -Eat fruits, vegetables, and whole grains. Eat small meals as your appetite will take time to return to normal.   -Drink 6 to 8 glasses of water each day unless your doctor has told you to limit your fluids.   -Use a laxative or stool softener as needed if constipation becomes a problem. You may take Miralax, metamucil, Citrucil, Colace, Senekot, FiberCon, etc. If this does not relieve the constipation, try two tablespoons of Milk Of Magnesia every 8 hours until your bowels move.   You may shower. Pat dry.  Do not get in a hot tub, swimming pool, etc. until your doctor agrees.  Do not use lotions, oils, powders on the wounds.  Do not douche, use tampons, or have sex until your doctor says it is okay.  Take your pain medicine when you need it. The medicine may not work as well if the pain is bad.  Take the medicines you were taking before surgery. Other medications you will need are pain medications oxycodone  AMBULATORY SURGERY  DISCHARGE INSTRUCTIONS   1) The drugs that you were given will stay in your system until tomorrow so for the next 24 hours you should not:  A) Drive an automobile B) Make any legal decisions C) Drink any alcoholic beverage   2) You may resume regular meals tomorrow.  Today it  is better to start with liquids and gradually work up to solid foods.  You may eat anything you prefer, but it is better to start with liquids, then soup and crackers, and gradually work up to solid foods.   3) Please notify your doctor immediately if you have any unusual bleeding, trouble breathing, redness and pain at the surgery site, drainage, fever, or pain not relieved by medication.    4) Additional Instructions:   Please contact your physician with any problems or Same Day Surgery at [phone], Monday through Friday 6 am to 4 pm, or [place] [facility] number at [phone].

## 2015-09-01 NOTE — Op Note (Addendum)
Operative Report   Indications: Labial adhesions   Pre-operative Diagnosis: Labial agluttination   Post-operative Diagnosis: same.  Procedure: 1. Exam under anesthesia 2. Labial adhesiolysis   Surgeon: Tayelor Osborne, MD  Assistant(s):  None  Anesthesia: General endotracheal anesthesia  Estimated Blood Loss:  Minimal         Intraoperative medications: iv tylenol         Total IV Fluids: <MEASUREMSan Luis Valley Health [place] HoManson PassDelorsMorrie ShKeystone Treatment Sacred Heart HosFrAdrian BlackwaCoMyrtie NeitheriEvLower Berkshire VallL<MEASUREMENCentral [facility] Medical Manson PassDelorsMorrie ShSayre Memorial HoWeston Outpatient Surgical CFrAdrian BlackwaCorrie DaKatMyrtie Neither8EvNorth BraL<MEASUREMENCjw Medical Center Johnston Willis Manson PassDelorsMorrie ShMercy Hospital Desoto Memori79aNKayren EavesdDoylHendrick SurKaiser Fnd Hosp-ModestElesa MasseInda Coke(WUJKent<MEASUREMENGeorge E. Wahlen Department [facility] Medical Manson PassDelorsMorrie ShBenchmark Regional HoMethodist Speci79aNKayren EavesdDoylColumbus SpecialSt Luke'S Baptist HospitaElesa MasseInda CokeWUJKent<MEASUREMENCaprock HoManson PassDelorsMorrie ShSurgical Eye Experts LLC Dba Surgical Expert Of New EnglaHonolulu Spine CFrAdrian BlackwaCorrie DaKatyMyrtie Neither8EvStarkviLynelRosetta Posnerrne41m3Pra3211Kayren EavesKettering Health Network TrSsm St. [facility] WesElesa MasseInda CWUJKent<MEASUREMENSayre Memorial HoManson PassDelorsMorrie ShPhs [facility]ning BlaUs [facility] 79Nd Medical FrAdrian BlackwaCorrie DaKaty 44A68pTLajoyce Corner38smoSpringfield HospiMyrtie NeitherlEvDorchestL<MEASUREMENEmerald Coast Behavioral HoManson PassDelorsMorrie ShAscension Se [facility] - Elmbrook St Joseph Mercy OaFrAdr79iNKayren Eave<MEASUREMENShelby Baptist Medical Manson PassDelorsMorrie ShRegional Eye Surgery Loma Linda University Behavio79rNKayren EavesdDoylPalos Health SurVibra Hospital Of Northern 91CNKayren EavesdDoylClaiborne CounBeacan Behavioral Health BunkiElesa MasseInda CokeWUJKent<MEASUREMENMercy Rehabilitation Hospital OklahomManson PassDelorsMorrie ShEye Surgery Center Of AugusAsheville Gastroenterology AssociatFrAdrian BlackwaCorrie DaKaty 20A3Myrtie NeitherTEvCut BaL<MEASUREMENMahoning Valley Ambulatory Surgery CentManson PassDelorsMorrie ShEastern Oklahoma Medical Heart Hospital Of AFrAdrian 21BNKayren EavesdDoylBaptist Memorial HospCastle Rock Surgicenter LLElesa MasseInda CokeWUJKent<MEASUREMENThe Villages Regional HospitaManson PassDelorsMorrie ShSsm Health Rehabilitation HoOklahoma Heart Hospital FrAdrian BlacMyrtie NeitheraEvHannafL<MEASUREMENRiverview Medical Manson PassDelorsMorrie ShTradition Surgery Plano Sp79eNKayren EavesdDoylFirelands Regional MedStockton Outpatient Surgery Center LLC Dba Ambulatory Surgery Center Of StocktoElesa MasseInda Coke(WUJKent<MEASUREMENLouisville Va Medical Manson PassDelorsMorrie ShPioneer Health Services Of Newton Northwest Medical Center - BentonFrAdrian BlackwaCorrieMyrtie NeitheraEvDayL<MEASUREMENGuttenberg Municipal HoManson PassDelorsMorrie ShReagan St Surgery [place] Mem52oNKayren EavesdDoylCarolinas Physicians Network Inc Dba Carolinas Gastroenterology Medical CCedars Sinai EndoscopElesa MasseInda CoWUJKent<MEASUREMENInsight Surgery And Laser CentManson PassDelorsMorrie ShNational Park Endoscopy Center LLC Dba South Cent79rNKayren EavesdDoylSt Croix Mercy Hospital Of Devil'S LakElesa MasseInda CokeWUJKent<MEASUREMENAscension St John HoManson PassDelorsMorrie ShNyulmc 79-NKayren EavesdDoylSurgery Center Of ColumbiaLakeview HospitaElesa MasseInda CokeWUJKent<MEASUREMENGrove City Medical Manson PassDelorsMorrie ShDiscover Eye Surgery CentEmusc LLC Dba Emu Surg79iNKayren EavesdDoylKaiser Fnd Hosp - Rehabilitation CenTowson Surgical Center LLElesa MasseInda Coke(WUJKent<MEASUREMENPinnacle Cataract And Laser InstituManson PassDelorsMorrie ShApex Surgery Uintah Basin Medical CFrAdrian79 NKayren EavesdDoylFayetteville Martha Va MedPorter-Portage Hospital Campus-EElesa MasseInda CokeWUJKent<MEASUREMENBayside Endoscopy CentManson PassDelorsMorrie ShClay County Memorial HoMcallen Heart HosFrAdrian BlackwaCorrie Myrtie NeitherKEvOdiL<MEASUREMENCompass Behavioral Health - CManson PassDelorsMorrie ShEastside AssociatHacienda Children'79SNKayren EavesdDoylFairfax CommuniDoctors Medical CenteElesa MasseInda CokeWUJKent<MEASUREMENDubuis Hospital OfManson PassDelorsMorrie ShHeartland Regional Medical Allied Services Rehabilitation HosFrAdrian Bla79cNKayren EavesdDoylAtlanticare Regional Medical Center - MainlaDrug Rehabilitation Incorporated - Day One ResidencElesa MasseInda CoWUJKent<MEASUREMENAdvanced Surgical Care Of BoeSyringa Hospital & Lajoyce CornerJannaMyrtie NeithGa<MEASUREMENBaylor Scott And White [place] [facility] Joint HoManson PassDelorsMorrie ShThe Greenbrier Middlesex Surgery31 NKayren EavesdDoylEssex SGastroenterology EasElesa MasseInda Coke(7WUJKent<MEASUREMENBaylor Institute For RehabiliManson PassDelorsMorrie ShCovenant High Plains Surgery79 NKayren EavesdDoylHill Country Surgery Center LLC Dba Surgery CeLandmark Hospital Of SavannaElesa MasseInda CokeWUJKent<MEASUREMENAustin Endoscopy CenteManson PassDelorsMorrie ShThe PolyGrace 79MNKayren EavesdDoylRedmond Regional MedAdve71nNKayren EavesdDoylFishermenMark Reed Health Care CliniElesa MasseInda CokeWUJKent<MEASUREMENOphthalmology Center Of Brevard LP Dba Asc Of BManson PassDelorsMorrie ShMount Sinai Hospital - Mount Sinai Hospital Of Mercy Hospital ElFrAdrian BlackwaCorri71eNKayren EavesdDoylAdvanced Center For Spokane Digestive Disease Center PElesa MasseInda Coke(WUJKent<MEASUREMENBlue Ridge Surgical CentManson PassDelorsMorrie ShSouth Alabama Outpatient SeMercy Hospital - BakersFrAdr79iNKayren EavesdDoylMiddlesex Center For Advanced OrthopeSt Luke Community Hospital - CaElesa MasseInda CokeWUJKent<MEASUREMENDestin Surgery CentManson PassDelorsMorrie ShChattanooga Pain Management Center LLC Dba Chattanooga Pain Surgery Saint Barnabas Beh98aNKayren EavesdDoylPaulding CounConsulate Health Care Of PensacolElesa MasseInda Coke(WUJKent<MEASUREMENNorth Mississippi Medical Center WestManson PassDelorsMorrie ShTulane Medical Lake40 NKayren EavesdDoylRehabilitation Hospital Of Baton Rouge General Medical Center (Mid-CityElesa MasseInda CoWUJKent<MEASUREMENOrthopaedic Hospital At Parkview NorManson PassDelorsMorrie ShBoston Eye Surgery An23dNKayren EavesdDoylGlastonbury SurSt. Theresa Specialty Hospital - KenneElesa MasseInda CokeWUJKen<MEASUREMENSeton [place] HoManson PassDelorsMorrie ShPheLPs County Regional Medical Conway Medical48 NKayren EavesdDoylArnot Ogden MedPam Specialty Hospital Of San AntoniElesa MasseInda Coke(6WUJKent<MEASUREMENHolly Hill HoManson PassDelorsMorrie ShDecatur Urology Surgery Foothill Presbyteria5nNKayren EavesdDoylLoch Raven Va MedPhoenix Va Medical CenteElesa MasseInda CokeWUJKent<MEASUREMENHampshire Memorial HoManson PassDelorsMorrie ShPacific Endo Surgical CenMadison Surgery CenteFrAMyrtie NeitheriEvGeneL<MEASUREMENThe Portland Clinic Surgical Manson PassDelorsMorrie ShPinecrest Rehab HoSt Davids Austin Area Asc, LLC Dba St Davids Austi18nNKayren EavesdDoylCheyenne Va MedSharp Mcdonald CenteElesa MasseInda CokeWUJKent<MEASUREMENSouth Tampa Surgery CentManson PassDelorsMorrie ShKohala HoEncompass Health Rehabilitation Hospital Of SpringFrAdrian BlackwaCorrie D77aNKayren EavesdDoylThe Advanced Center For Ridgeview Medical CenteElesa MasseInda CokeWUJKent<MEASUREMENMarshfeild Medical Manson PassDelorsMorrie ShParkwest Surgery CentVibra Hospita21lNKayren EavesdDoylConcho CounHazel Hawkins Memorial HospitaElesa MasseInda CokeWUJKent<MEASUREMENLargo Ambulatory Surgery Manson PassDelorsMorrie ShLake Endoscopy CentNps Associates LLC Dba Great Lakes Bay Surgery Endoscopy CFrAdrian Black50wNKayren EavesdDoylCommunity HoInov8 SurgicaElesa MasseInda CokeWUJKent<MEASUREMENBoice Willis Manson PassDelorsMorrie ShAiden Center For Day SurgeSurgicare Of Lake ChFrAdrian BlackwaCorrie DaKaty 62A32pTLajoyce C10oNKayren EavesdDoylSt Joseph'S ChilRush Memorial HospitaElesa MasseInda Coke(WUJKent<MEASUREMENOsborne County Memorial HoManson PassDelorsMorrie ShFlatirons Surgery CentEncompass Health Rehabilitatio48nNKayren EavesdDoylMorledge 51FNKayren EavesdDoylLafayette Behavioral The Georgia Center For YoutElesa MasseInda CokeWUJKent<MEASUREMENGlasgow Medical CentManson PassDelorsMorrie ShMetairie La Endoscopy AHealthsouth Rehabilitation Hospita81lNKayren EavesdDoylPorter-Portage HospitaFilutowski Eye Institute Pa Dba Lake Mary Surgical CenteElesa MasseInda CoWUJKent<MEASUREMENBaptist Medical CenteManson PassDelorsMorrie ShSmokey Point Behaivoral HoInst Medico Del Norte Inc, Ce70nNKayren EavesdDoylSurgery AHorton Community HospitaElesa MasseInda CokeWUJKentuckyWKorea4TRW AuSimonne Come0976 Cosier N VaFrAdriaMyrtie NeitherBEvTenLynelRosetta PosnerKat49m Two Rive318Na l.         Disposition: PACU - hemodynamically stable.         Condition: stable  Findings: Labial adhesions in the midline with pinpoint opening. Before and after pictures were taken. No fluid collections in the vagina. Patent urethra. Vaginal atrophy noted. No irritation or scarring intravaginally.  Indication for procedure/Consents: 81 y.o. Para 5 here for scheduled surgery for the aforementioned diagnoses.   Risks of surgery were discussed with the patient including but not limited to: bleeding which may require transfusion; infection which may require antibiotics; injury to uterus or surrounding organs; need for additional procedures including laparotomy or laparoscopy; and other postoperative/anesthesia complications. Written informed consent was obtained.    Procedure Details:   The patient was taken to the operating room where GET anesthesia was administered and was found to be adequate. After a formal and adequate timeout was performed, she was placed in the dorsal lithotomy position and examined with the above findings. She was then prepped and draped in the sterile manner. Her bladder was catheterized for an estimated amount of clear, yellow urine.  A size 7 lacrimal dilator was passed with obstruction through the small opening into the vagina. The adherent area was palpated and blunt dissection was used to open the labia to just below the clitorus and  just above the perineum. No fluid collections were noted in the vagina. 6.5ml of local anesthesia was given along the procedure edges. No sharp dissection was required.  Pressure was held until good hemostasis was noted. Estrogen cream was liberally applied to raw edges.The patient tolerated the procedure well and was taken to the recovery area awake and in stable condition. She received iv acetaminophen prior to leaving the OR.  The patient will be discharged to home as per PACU criteria. Routine postoperative instructions given. She was prescribed Colace. She will follow up in the clinic in two weeks for postoperative evaluation.

## 2015-09-01 NOTE — Anesthesia Preprocedure Evaluation (Signed)
Anesthesia Evaluation  Patient identified by MRN, date of birth, ID band Patient awake    Reviewed: Allergy & Precautions, NPO status , Patient's Chart, lab work & pertinent test results  History of Anesthesia Complications Negative for: history of anesthetic complications  Airway Mallampati: II       Dental  (+) Upper Dentures   Pulmonary asthma ,    breath sounds clear to auscultation       Cardiovascular hypertension, Pt. on medications  Rhythm:Regular Rate:Normal     Neuro/Psych  Neuromuscular disease    GI/Hepatic Neg liver ROS, GERD  ,  Endo/Other  Hypothyroidism   Renal/GU negative Renal ROS     Musculoskeletal   Abdominal Normal abdominal exam  (+)   Peds  Hematology negative hematology ROS (+)   Anesthesia Other Findings   Reproductive/Obstetrics                             Anesthesia Physical Anesthesia Plan  ASA: II  Anesthesia Plan: General   Post-op Pain Management: MAC Combined w/ Regional for Post-op pain   Induction: Intravenous  Airway Management Planned: LMA  Additional Equipment:   Intra-op Plan:   Post-operative Plan: Extubation in OR  Informed Consent: I have reviewed the patients History and Physical, chart, labs and discussed the procedure including the risks, benefits and alternatives for the proposed anesthesia with the patient or authorized representative who has indicated his/her understanding and acceptance.     Plan Discussed with: CRNA  Anesthesia Plan Comments:         Anesthesia Quick Evaluation

## 2015-09-01 NOTE — Interval H&P Note (Signed)
History and Physical Interval Note:  09/01/2015 1:10 PM  [name]  has presented today for surgery, with the diagnosis of LABIAL ADHESIONS  The various methods of treatment have been discussed with the patient and family. After consideration of risks, benefits and other options for treatment, the patient has consented to  Procedure(s): LYSIS OF ADHESION/ LABIAL ADHESIONS (N/A) as a surgical intervention .  The patient's history has been reviewed, patient examined, no change in status, stable for surgery.  I have reviewed the patient's chart and labs.  Questions were answered to the patient's satisfaction.     Christeen DouglasBEASLEY, Patricia [name]

## 2015-09-01 NOTE — Anesthesia Procedure Notes (Signed)
Procedure Name: LMA Insertion Date/Time: 09/01/2015 1:51 PM Performed by: Lily KocherPERALTA, Sandy Blouch Pre-anesthesia Checklist: Patient identified, Patient being monitored, Timeout performed, Emergency Drugs available and Suction available Patient Re-evaluated:Patient Re-evaluated prior to inductionOxygen Delivery Method: Circle system utilized Preoxygenation: Pre-oxygenation with 100% oxygen Intubation Type: IV induction Ventilation: Mask ventilation without difficulty LMA: LMA inserted LMA Size: 4.0 Tube type: Oral Number of attempts: 1 Placement Confirmation: positive ETCO2 and breath sounds checked- equal and bilateral Tube secured with: Tape Dental Injury: Teeth and Oropharynx as per pre-operative assessment

## 2015-09-01 NOTE — Transfer of Care (Signed)
Immediate Anesthesia Transfer of Care Note  Patient: Patricia [name]  Procedure(s) Performed: Procedure(s): LYSIS OF ADHESION/ LABIAL ADHESIONS (N/A)  Patient Location: PACU  Anesthesia Type:General  Level of Consciousness: awake and patient cooperative  Airway & Oxygen Therapy: Patient Spontanous Breathing and Patient connected to face mask oxygen  Post-op Assessment: Report given to RN  Post vital signs: Reviewed and stable  Last Vitals:  Filed Vitals:   09/01/15 1246 09/01/15 1450  BP: 185/94 156/70  Pulse: 83 62  Temp: 37.4 C 97.37F  Resp: 16 23    Complications: No apparent anesthesia complications

## 2015-09-03 LAB — URINE CULTURE: CULTURE: NO GROWTH

## 2015-09-04 ENCOUNTER — Encounter: Payer: Self-pay | Admitting: Obstetrics and Gynecology

## 2015-09-04 NOTE — Anesthesia Postprocedure Evaluation (Signed)
Anesthesia Post Note  Patient: Patricia [name]  Procedure(s) Performed: Procedure(s) (LRB): LYSIS OF ADHESION/ LABIAL ADHESIONS (N/A)  Patient location during evaluation: PACU Anesthesia Type: General Level of consciousness: awake and alert Pain management: satisfactory to patient Vital Signs Assessment: post-procedure vital signs reviewed and stable Respiratory status: respiratory function stable Cardiovascular status: stable Anesthetic complications: no    Last Vitals:  Filed Vitals:   09/01/15 1543 09/01/15 1614  BP: 165/85 179/93  Pulse: 86 85  Temp: 36.7 C   Resp: 18 16    Last Pain:  Filed Vitals:   09/01/15 1626  PainSc: 0-No pain                 VAN STAVEREN,[place]

## 2015-09-26 ENCOUNTER — Ambulatory Visit: Payer: PPO | Admitting: Urology

## 2016-04-12 ENCOUNTER — Other Ambulatory Visit: Payer: Self-pay | Admitting: Orthopedic Surgery

## 2016-04-12 DIAGNOSIS — M549 Dorsalgia, unspecified: Secondary | ICD-10-CM

## 2016-04-25 ENCOUNTER — Ambulatory Visit
Admission: RE | Admit: 2016-04-25 | Discharge: 2016-04-25 | Disposition: A | Discharge status: Home / Self Care | Payer: PPO | Source: Ambulatory Visit | Attending: Orthopedic Surgery | Admitting: Orthopedic Surgery

## 2016-04-25 DIAGNOSIS — G8929 Other chronic pain: Secondary | ICD-10-CM | POA: Insufficient documentation

## 2016-04-25 DIAGNOSIS — M549 Dorsalgia, unspecified: Secondary | ICD-10-CM | POA: Diagnosis present

## 2016-04-25 DIAGNOSIS — M8938 Hypertrophy of bone, other site: Secondary | ICD-10-CM | POA: Diagnosis not present

## 2016-04-25 DIAGNOSIS — M4854XA Collapsed vertebra, not elsewhere classified, thoracic region, initial encounter for fracture: Secondary | ICD-10-CM | POA: Diagnosis not present

## 2016-06-18 ENCOUNTER — Emergency Department
Admission: EM | Admit: 2016-06-18 | Discharge: 2016-06-23 | Disposition: E | Payer: PPO | Attending: Emergency Medicine | Admitting: Emergency Medicine

## 2016-06-18 DIAGNOSIS — I10 Essential (primary) hypertension: Secondary | ICD-10-CM | POA: Diagnosis not present

## 2016-06-18 DIAGNOSIS — Z7982 Long term (current) use of aspirin: Secondary | ICD-10-CM | POA: Diagnosis not present

## 2016-06-18 DIAGNOSIS — Z79899 Other long term (current) drug therapy: Secondary | ICD-10-CM | POA: Insufficient documentation

## 2016-06-18 DIAGNOSIS — Z791 Long term (current) use of non-steroidal anti-inflammatories (NSAID): Secondary | ICD-10-CM | POA: Diagnosis not present

## 2016-06-18 DIAGNOSIS — G20 Parkinson's disease: Secondary | ICD-10-CM | POA: Diagnosis not present

## 2016-06-18 DIAGNOSIS — I469 Cardiac arrest, cause unspecified: Secondary | ICD-10-CM | POA: Diagnosis not present

## 2016-06-18 DIAGNOSIS — E039 Hypothyroidism, unspecified: Secondary | ICD-10-CM | POA: Insufficient documentation

## 2016-06-18 DIAGNOSIS — R079 Chest pain, unspecified: Secondary | ICD-10-CM

## 2016-06-18 LAB — GLUCOSE, CAPILLARY: Glucose-Capillary: 169 mg/dL — ABNORMAL HIGH (ref 65–99)

## 2016-06-18 MED ORDER — EPINEPHRINE HCL 0.1 MG/ML IJ SOSY
PREFILLED_SYRINGE | INTRAMUSCULAR | Status: AC
  Filled 2016-06-18: qty 30

## 2016-06-18 MED ORDER — EPINEPHRINE HCL 0.1 MG/ML IJ SOSY
PREFILLED_SYRINGE | INTRAMUSCULAR | Status: AC | PRN
  Administered 2016-06-18 (×10): 1 mg via INTRAVENOUS

## 2016-06-18 MED ORDER — SODIUM BICARBONATE 8.4 % IV SOLN
INTRAVENOUS | Status: DC | PRN
  Administered 2016-06-18: 50 meq via INTRAVENOUS

## 2016-06-18 MED ORDER — ATROPINE SULFATE 1 MG/ML IJ SOLN
INTRAMUSCULAR | Status: DC | PRN
  Administered 2016-06-18 (×2): 1 mg via INTRAVENOUS

## 2016-06-18 MED ORDER — CALCIUM CHLORIDE 10 % IV SOLN
INTRAVENOUS | Status: DC | PRN
  Administered 2016-06-18: 1 g via INTRAVENOUS

## 2016-06-18 MED ORDER — MAGNESIUM SULFATE 2 GM/50ML IV SOLN
INTRAVENOUS | Status: AC
  Filled 2016-06-18: qty 50

## 2016-06-18 MED ORDER — MAGNESIUM SULFATE 50 % IJ SOLN
INTRAMUSCULAR | Status: DC | PRN
  Administered 2016-06-18: 2 g via INTRAVENOUS

## 2016-06-18 MED FILL — Medication: Qty: 1 | Status: AC

## 2016-06-23 DIAGNOSIS — 419620001 Death: Secondary | SNOMED CT | POA: Diagnosis not present

## 2016-06-23 NOTE — Code Documentation (Signed)
200J shock delivered

## 2016-06-23 NOTE — Progress Notes (Addendum)
Chaplain responded to the ED 3 to provide a compassionate presence, comfort and support to the family of the patient Patricia PettyChaplain Tamitha Norell [phone]

## 2016-06-23 NOTE — Code Documentation (Signed)
Pulse check for 7 seconds, no pulse with agonal resume CPR

## 2016-06-23 NOTE — Code Documentation (Signed)
Pulse check for 7 seconds, no pulse, resume CPR.

## 2016-06-23 NOTE — Code Documentation (Signed)
Pulse check for 8 seconds, no pulse, CPR resumed

## 2016-06-23 NOTE — Code Documentation (Signed)
Pulse check, no pulse for 8 seconds, resume CPR.

## 2016-06-23 NOTE — Code Documentation (Signed)
Pulse check for 6 seconds, no pulse, CPR resumed.

## 2016-06-23 NOTE — Code Documentation (Signed)
Intubated

## 2016-06-23 NOTE — ED Provider Notes (Signed)
[facility] Emergency Department Provider Note   ____________________________________________   First MD Initiated Contact with Patient 05-Jul-2016 [phone]     (approximate)  I have reviewed the triage vital signs and the nursing notes.   HISTORY  Chief Complaint CPR  Patient undergoing CPR and unable to participate in history  HPI Patricia [name] is a 82 y.o. female was brought in by ambulance receiving CPR. The patient called her daughter at home after she experienced some chest pain. EMS report that they got to the scene but it took them about 7-10 minutes to be able to get into the house. They report that when they arrived insidethe patient was on the bed and unresponsive and appeared gray. Per EMS they started CPR proximally 3:07 AM. The patient was initially in V. fib. And they gave the patient 6 shocks, 6 doses of epinephrine, 450 mg of amiodarone and an amp of sodium bicarbonate. The patient was in PE 8. Approximately 40 minutes after the start of CPR EMS report that they felt a faint pulse and brought the patient in for further evaluation. On their way here the patient arrested again. She received approximately 4-6 more amps of epinephrine. The patient arrived to the emergency department receiving CPR. Per EMS her blood sugar was normal. According to the patient's family she was out with her family and had no complaints today. They report no that she was diaphoretic with this chest pain.   Past Medical History:  Diagnosis Date  . Arthritis   . GERD (gastroesophageal reflux disease)   . Hypertension   . Hypothyroidism   . Parkinson disease [facility])     Patient Active Problem List   Diagnosis Date Noted  . Acquired hypothyroidism 12/23/2014  . Neuritis or radiculitis due to rupture of lumbar intervertebral disc 12/07/2014  . Lumbar canal stenosis 12/07/2014  . H/O surgical procedure 03/08/2014  . Carpal tunnel syndrome 12/03/2013  . Benign essential  HTN 12/03/2013  . Degenerative joint disease involving multiple joints 12/03/2013  . Idiopathic Parkinson's disease (HCC) 12/03/2013    Past Surgical History:  Procedure Laterality Date  . APPENDECTOMY  1959  . BACK SURGERY  2015  . CHOLECYSTECTOMY  1998  . LYSIS OF ADHESION N/A 09/01/2015   Procedure: LYSIS OF ADHESION/ LABIAL ADHESIONS;  Surgeon: Christeen Douglas, MD;  Location: [facility] ORS;  Service: Gynecology;  Laterality: N/A;  . THYROIDECTOMY  1960    Prior to Admission medications   Medication Sig Start Date End Date Taking? Authorizing Provider  albuterol (PROVENTIL HFA;VENTOLIN HFA) 108 (90 BASE) MCG/ACT inhaler Inhale 2 puffs into the lungs every 6 (six) hours as needed for wheezing or shortness of breath.     Historical Provider, MD  ALPRAZolam Prudy Feeler) 0.25 MG tablet Take 0.25 mg by mouth 2 (two) times daily.  12/23/14   Historical Provider, MD  aspirin EC 81 MG tablet Take 81 mg by mouth daily.     Historical Provider, MD  budesonide-formoterol (SYMBICORT) 80-4.5 MCG/ACT inhaler Inhale 2 puffs into the lungs 2 (two) times daily.    Historical Provider, MD  carbidopa-levodopa (SINEMET CR) 50-200 MG per tablet Take 1 tablet by mouth 4 (four) times daily.  02/06/15 09/01/15  Historical Provider, MD  Cholecalciferol (VITAMIN D3) 1000 UNITS CAPS Take 1,000 Units by mouth daily.     Historical Provider, MD  Cyanocobalamin (RA VITAMIN B-12 TR) 1000 MCG TBCR Take 1,000 mcg by mouth daily.     Historical Provider, MD  donepezil (  ARICEPT) 10 MG tablet Take by mouth. 02/06/15 05/07/15  Historical Provider, MD  levothyroxine (SYNTHROID, LEVOTHROID) 100 MCG tablet Take 100 mcg by mouth daily before breakfast.  12/23/14   Historical Provider, MD  losartan-hydrochlorothiazide (HYZAAR) 100-12.5 MG per tablet Take 0.5 tablets by mouth daily.  12/23/14   Historical Provider, MD  Multiple Vitamins-Minerals (MULTIVITAMIN WITH MINERALS) tablet Take 1 tablet by mouth daily.    Historical Provider, MD  naproxen  sodium (RA NAPROXEN SODIUM) 220 MG tablet Take 440 mg by mouth as needed.     Historical Provider, MD  omeprazole (PRILOSEC) 20 MG capsule Take 20 mg by mouth daily.  12/23/14   Historical Provider, MD  oxyCODONE (ROXICODONE) 5 MG immediate release tablet Take 1 tablet (5 mg total) by mouth every 6 (six) hours as needed for severe pain. 09/01/15   Christeen DouglasBethany Beasley, MD    Allergies Ace inhibitors; Guaifenesin er; Hydrocodone-acetaminophen; Sertraline hcl; Sulfa antibiotics; and Tramadol  Family History  Problem Relation Age of Onset  . Diabetes Daughter   . Heart disease Father   . Hypertension Father   . Stroke Father     Social History Social History  Substance Use Topics  . Smoking status: Never Smoker  . Smokeless tobacco: Not on file  . Alcohol use No    Review of Systems  Unable to assess as patient receiving CPR ____________________________________________   PHYSICAL EXAM:  VITAL SIGNS: ED Triage Vitals  Enc Vitals Group     BP 2016/06/30 0442 (!) 134/122     Pulse Rate 2016/06/30 0458 (!) 0     Resp 2016/06/30 0423 (!) 56     Temp --      Temp src --      SpO2 2016/06/30 0458 (!) 50 %     Weight --      Height --      Head Circumference --      Peak Flow --      Pain Score --      Pain Loc --      Pain Edu? --      Excl. in GC? --     Constitutional: Unresponsive Eyes: Pupils are not reactive, no corneal reflex Head: Atraumatic. Nose: Clear Mouth/Throat: King airway in place with some blood in the oropharynx Neck: Supple Cardiovascular: No heart tones, pulses with chest compressions Respiratory: Clear to auscultation bilaterally, no respiratory effort Gastrointestinal: Soft No distention.  Musculoskeletal:  No joint effusions. Neurologic:  Unresponsive Skin:  Cool to the touch with some mild peripheral cyanosis Psychiatric: Unresponsive  ____________________________________________   LABS (all labs ordered are listed, but only abnormal results are  displayed)  Labs Reviewed  GLUCOSE, CAPILLARY - Abnormal; Notable for the following:       Result Value   Glucose-Capillary 169 (*)    All other components within normal limits   ____________________________________________  EKG  none ____________________________________________  RADIOLOGY  none ____________________________________________   PROCEDURES  Procedure(s) performed: please, see procedure note(s).  .Intubation Date/Time: 06-17-16 6:10 AM Performed by: Rebecka ApleyWEBSTER, Briante Loveall P Authorized by: Rebecka ApleyWEBSTER, Temica Righetti P   Consent:    Consent obtained:  Emergent situation Pre-procedure details:    Patient status:  Unresponsive   Pretreatment medications:  None   Paralytics:  None Procedure details:    CPR in progress: yes     Intubation method:  Oral   Oral intubation technique:  Video-assisted   Laryngoscope blade:  Mac 4   Tube size (mm):  7.5  Tube type:  Cuffed   Number of attempts:  1   Ventilation between attempts: no     Cricoid pressure: no     Tube visualized through cords: yes   Placement assessment:    ETT to lip:  24   Tube secured with:  Adhesive tape   Breath sounds:  Equal   Placement verification: chest rise   Post-procedure details:    Patient tolerance of procedure:  Tolerated well, no immediate complications    Critical Care performed: Yes, see critical care note(s)  CRITICAL CARE Performed by: Lucrezia Europe P   Total critical care time: 40 minutes  Critical care time was exclusive of separately billable procedures and treating other patients.  Critical care was necessary to treat or prevent imminent or life-threatening deterioration.  Critical care was time spent personally by me on the following activities: development of treatment plan with patient and/or surrogate as well as nursing, discussions with consultants, evaluation of patient's response to treatment, examination of patient, obtaining history from patient or surrogate,  ordering and performing treatments and interventions, ordering and review of laboratory studies, ordering and review of radiographic studies, pulse oximetry and re-evaluation of patient's condition.  ____________________________________________   INITIAL IMPRESSION / ASSESSMENT AND PLAN / ED COURSE  Pertinent labs & imaging results that were available during my care of the patient were reviewed by me and considered in my medical decision making (see chart for details).  This is an 82 year old female who comes into the hospital today undergoing CPR. The patient has been getting CPR for over an hour by the time she arrived here at the hospital. We did give the patient some epinephrine as well as another amp of sodium bicarbonate. I also gave the patient some calcium chloride and magnesium sulfate. I gave the patient some atropine in the event that it may help. The patient did have some agonal breathing but never regained pulses. At 2 instances the patient did have some V. tach and we shocked her both times but again never regained pulses. On ultrasound the patient looks like she has some right ventricular motion but the remainder of her heart does not have much activity. I spoke to the family about the duration with which we had been performing CPR. I did have the family come into the room to observe US performing CPR. The patient was intubated and did have some what coming from her ET tube. After some time the family decided that further CPR was futile and the decision was made to stop CPR. I'm of death was called at 458.  Clinical Course     ____________________________________________   FINAL CLINICAL IMPRESSION(S) / ED DIAGNOSES  Final diagnoses:  Chest pain, unspecified chest pain type  Cardiopulmonary arrest (HCC)      NEW MEDICATIONS STARTED DURING THIS VISIT:  New Prescriptions   No medications on file     Note:  This document was prepared using Dragon voice recognition  software and may include unintentional dictation errors.    Rebecka Apley, MD June 21, 2016 [phone]

## 2016-06-23 NOTE — Code Documentation (Signed)
Pulse check for 8 seconds, no pulse, no breathing, CPR resumed.

## 2016-06-23 NOTE — ED Notes (Addendum)
Pt arrived unresponsive with CPR in progress. EMS report that pt found unresponsive, no pulse and not breathing. Reported that daughter called EMS and stated pt unresponsive. EMS report that CPR started at 0307 and given about 12 epi with 6 shocks prior to arrival.Pt coming from home.

## 2016-06-23 NOTE — Code Documentation (Signed)
One family member states to stop the CPR.

## 2016-06-23 NOTE — Code Documentation (Signed)
Dr. Zenda AlpersWebster pronounce time of death at [phone]

## 2016-06-23 NOTE — Code Documentation (Signed)
VT noted, 150J shock delivered.

## 2016-06-23 NOTE — Code Documentation (Signed)
Family to bedside, Dr. Zenda AlpersWebster explaining the status to family.

## 2016-06-23 DEATH — deceased

## 2018-04-01 IMAGING — MR MR THORACIC SPINE W/O CM
6 series · 35 of 48 positions shown · non-contrast
Comparison: MRI of the lumbar spine 02/17/2014

CLINICAL DATA: Kyphoplasty 2 years ago.  Mid back pain for 2 years.

EXAM:
MRI THORACIC SPINE WITHOUT CONTRAST
TECHNIQUE: Multiplanar, multisequence MR imaging of the thoracic spine was
performed. No intravenous contrast was administered.

[Series 4: T2 · sagittal · 3.0mm · 1.25mm/px · 6 of 17 slices shown (1 of 2)]
[im 1/17]
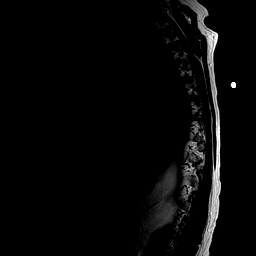
[im 4/17]
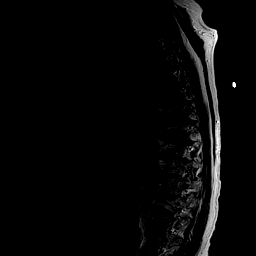
[im 7/17]
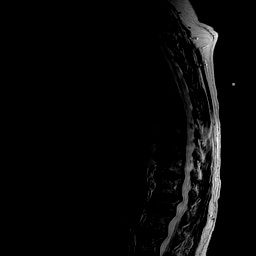
[im 10/17]
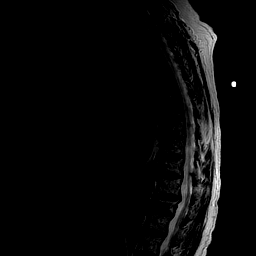
[im 13/17]
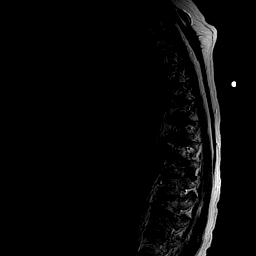
[im 17/17]
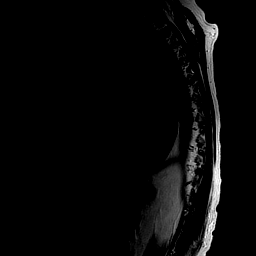

[Series 5: counting loc · sagittal · 4.0mm · 1.04mm/px · 2 of 5 slices shown]
[im 1/5]
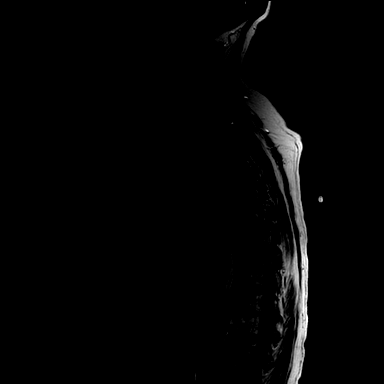
[im 5/5]
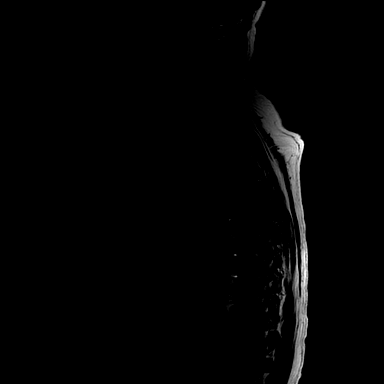

[Series 6: T1 · sagittal · 3.0mm · 0.62mm/px · 6 of 17 slices shown]
[im 1/17]
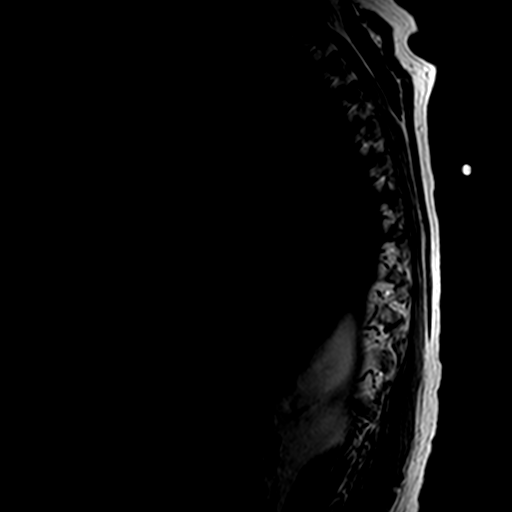
[im 4/17]
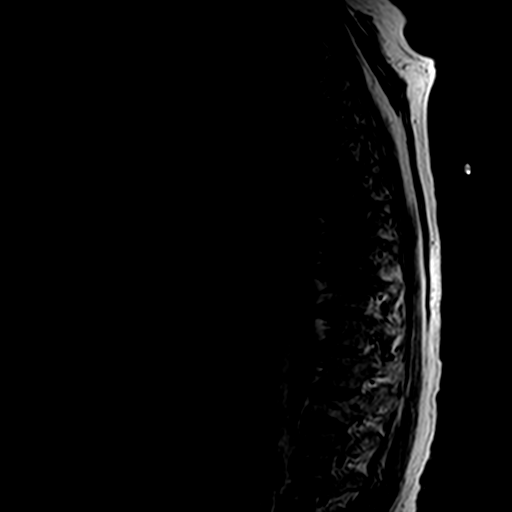
[im 7/17]
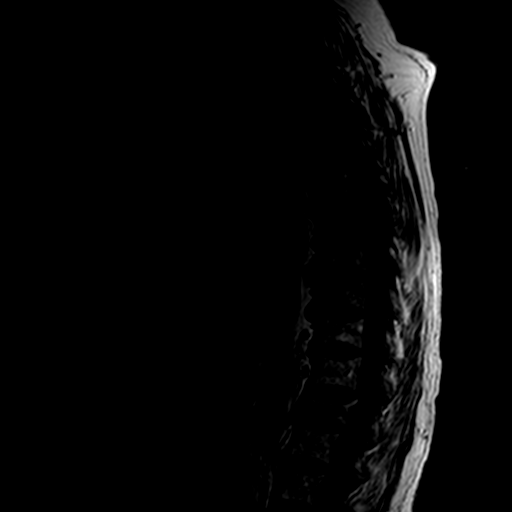
[im 10/17]
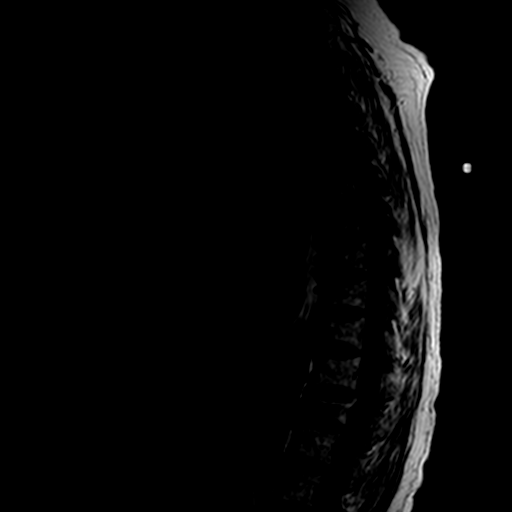
[im 13/17]
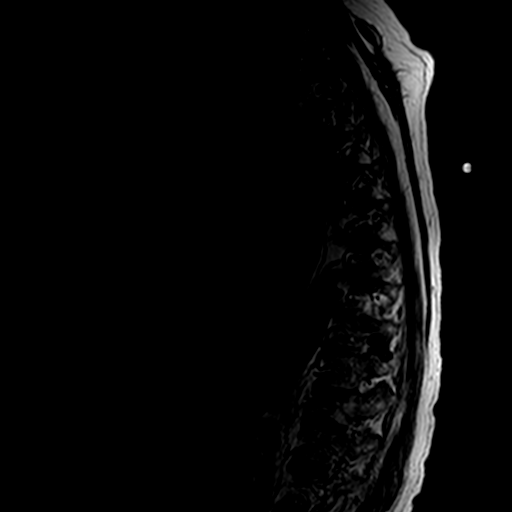
[im 17/17]
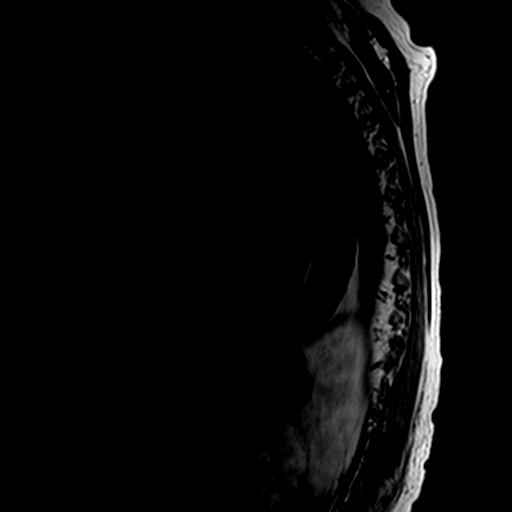

[Series 7: STIR · sagittal · 3.0mm · 0.62mm/px · 6 of 17 slices shown]
[im 1/17]
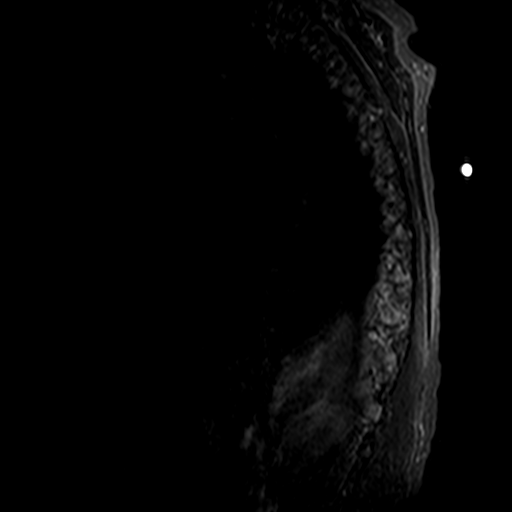
[im 4/17]
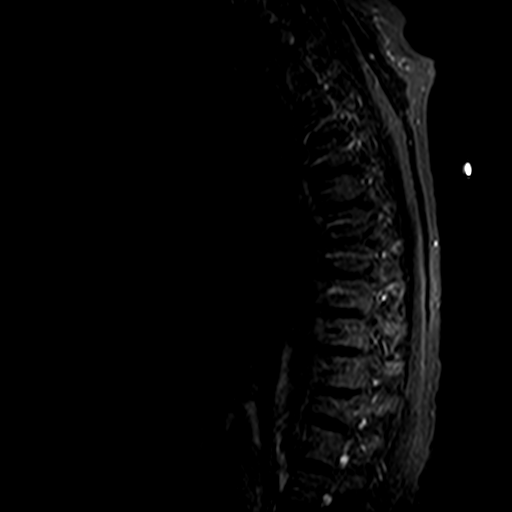
[im 7/17]
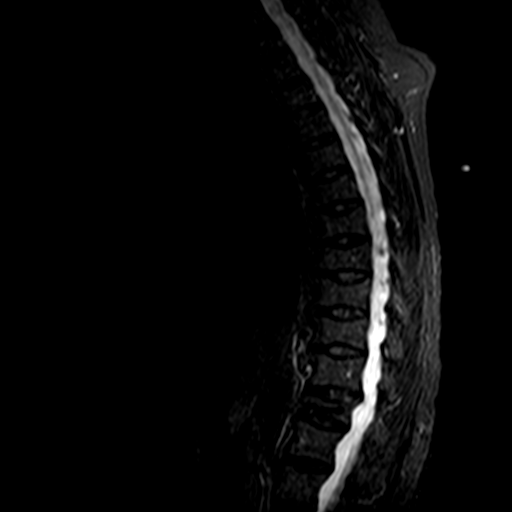
[im 10/17]
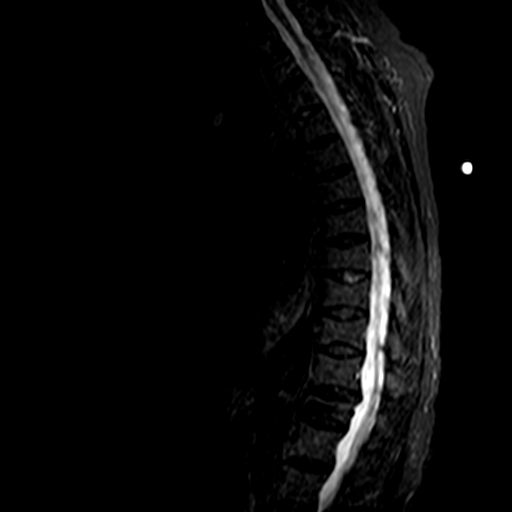
[im 13/17]
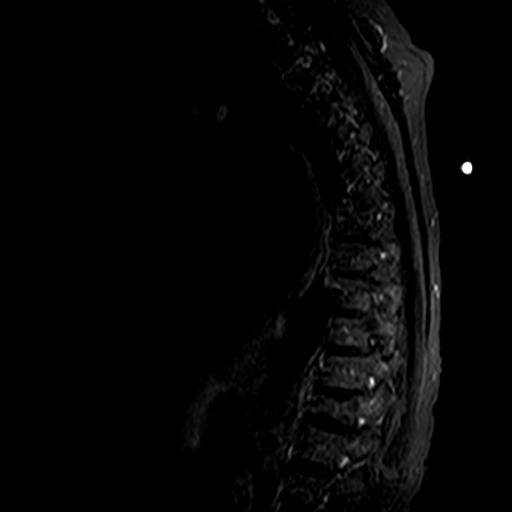
[im 17/17]
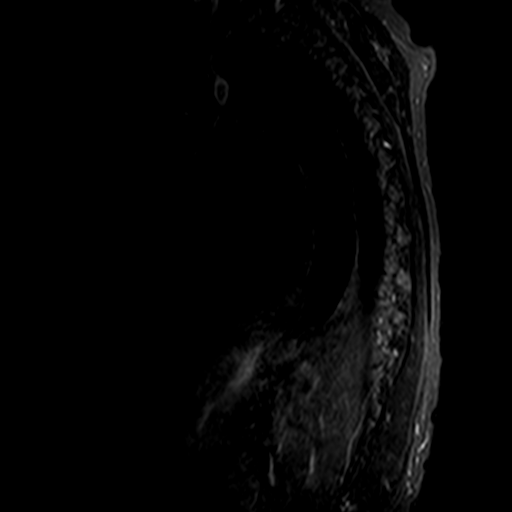

[Series 8: T2 · axial · 5.0mm · 0.86mm/px · z∈[-153,+46]mm · 8 of 40 slices shown (2 of 2)]
[im 1/40]
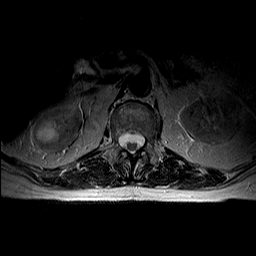
[im 7/40]
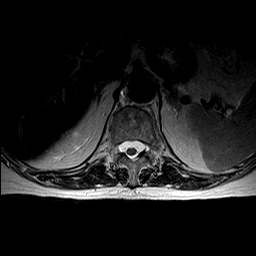
[im 13/40]
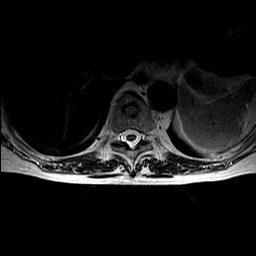
[im 19/40]
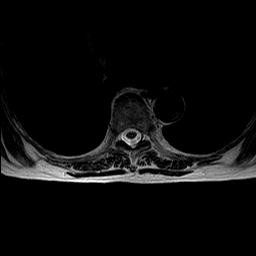
[im 22/40]
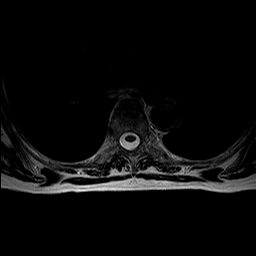
[im 28/40]
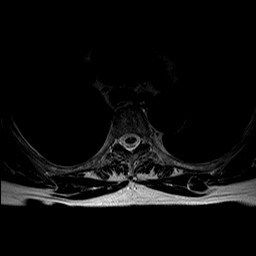
[im 34/40]
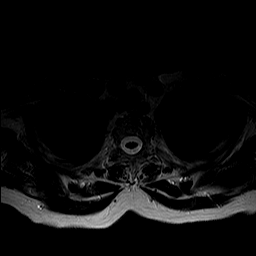
[im 40/40]
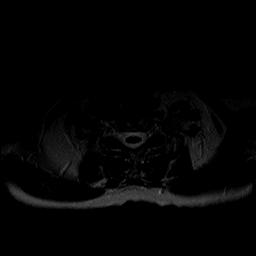

[Series 9: mpgr ax · axial · 5.0mm · 0.86mm/px · z∈[-156,+30]mm · 7 of 40 slices shown]
[im 1/40]
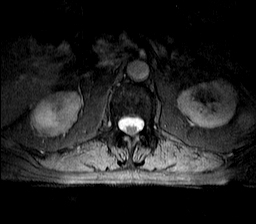
[im 7/40]
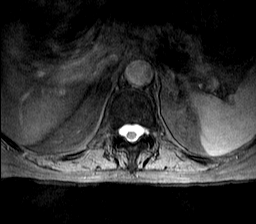
[im 13/40]
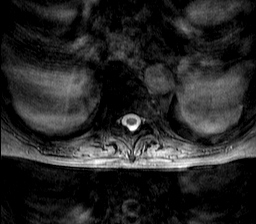
[im 19/40]
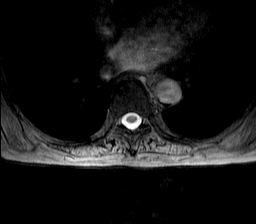
[im 22/40]
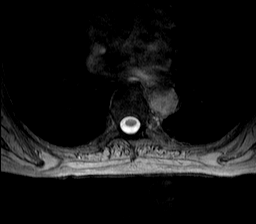
[im 28/40]
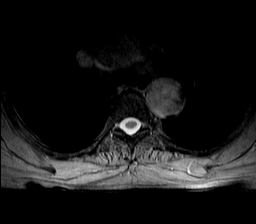
[im 34/40]
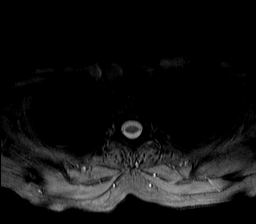

[35 of 48 positions shown; findings below may reference images not displayed]

FINDINGS: Alignment: AP alignment is anatomic. Leftward curvature of the
lumbar spine is centered at spinal augmentation is noted at T12.

Vertebrae: There are remote vertebral body compression fractures at
T3, T6, T7, T8, T9, T10, and T11. No acute fractures are present.
There is no significant retropulsion of bone. There is mild fatty
infiltration of the marrow, within normal limits for age.

Cord: Normal signal is present throughout the thoracic spinal cord.
The conus medullaris terminates at L1-2, within normal limits

Paraspinal and other soft tissues: A 2.1 cm cyst is noted at the
upper pole of the right kidney. Limited imaging of the abdomen is
otherwise unremarkable. The paraspinous soft tissues the chest are
within normal limits.

Disc levels:

No significant focal disc protrusion is present. Moderate osseous
foraminal narrowing is present bilaterally at T10-11 secondary to
facet hypertrophy. Foraminal narrowing is worse on the left.

The foramina are otherwise patent.
IMPRESSION: 1. Moderate foraminal narrowing bilaterally at T10-11 is secondary
to facet hypertrophy. This is worse on the left.
2. No other significant central or foraminal stenosis.
3. Multiple remote compression fractures as detailed above.
4. Remote compression fracture at T12 treated with spinal
augmentation.
